# Patient Record
Sex: Female | Born: 1970 | Race: White | Hispanic: No | Marital: Married | Smoking: Never smoker
Health system: Southern US, Community
[De-identification: ages and names within clinical notes are randomized; demographics above are authoritative.]

## PROBLEM LIST (undated history)

## (undated) DIAGNOSIS — M542 Cervicalgia: Secondary | ICD-10-CM

## (undated) DIAGNOSIS — D649 Anemia, unspecified: Secondary | ICD-10-CM

## (undated) DIAGNOSIS — E119 Type 2 diabetes mellitus without complications: Secondary | ICD-10-CM

## (undated) DIAGNOSIS — R519 Headache, unspecified: Secondary | ICD-10-CM

## (undated) DIAGNOSIS — R7303 Prediabetes: Secondary | ICD-10-CM

## (undated) DIAGNOSIS — Z5189 Encounter for other specified aftercare: Secondary | ICD-10-CM

## (undated) DIAGNOSIS — K219 Gastro-esophageal reflux disease without esophagitis: Secondary | ICD-10-CM

## (undated) DIAGNOSIS — I10 Essential (primary) hypertension: Secondary | ICD-10-CM

## (undated) DIAGNOSIS — Z973 Presence of spectacles and contact lenses: Secondary | ICD-10-CM

## (undated) DIAGNOSIS — F419 Anxiety disorder, unspecified: Secondary | ICD-10-CM

## (undated) DIAGNOSIS — R7309 Other abnormal glucose: Secondary | ICD-10-CM

## (undated) DIAGNOSIS — D259 Leiomyoma of uterus, unspecified: Secondary | ICD-10-CM

## (undated) DIAGNOSIS — E1142 Type 2 diabetes mellitus with diabetic polyneuropathy: Secondary | ICD-10-CM

## (undated) DIAGNOSIS — M722 Plantar fascial fibromatosis: Secondary | ICD-10-CM

## (undated) DIAGNOSIS — R011 Cardiac murmur, unspecified: Secondary | ICD-10-CM

## (undated) DIAGNOSIS — Z86718 Personal history of other venous thrombosis and embolism: Secondary | ICD-10-CM

## (undated) HISTORY — DX: Encounter for other specified aftercare: Z51.89

## (undated) HISTORY — DX: Essential (primary) hypertension: I10

## (undated) HISTORY — PX: FOOT SURGERY: SHX648

## (undated) HISTORY — DX: Type 2 diabetes mellitus without complications: E11.9

## (undated) HISTORY — PX: TUBAL LIGATION: SHX77

## (undated) HISTORY — PX: TONSILLECTOMY: SUR1361

## (undated) HISTORY — DX: Cardiac murmur, unspecified: R01.1

## (undated) HISTORY — DX: Plantar fascial fibromatosis: M72.2

## (undated) HISTORY — DX: Anemia, unspecified: D64.9

## (undated) HISTORY — DX: Other abnormal glucose: R73.09

## (undated) HISTORY — DX: Cervicalgia: M54.2

---
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
## 1997-08-28 HISTORY — PX: TUBAL LIGATION: SHX77

## 1998-04-14 ENCOUNTER — Inpatient Hospital Stay (HOSPITAL_COMMUNITY): Admission: AD | Admit: 1998-04-14 | Discharge: 1998-04-17 | Payer: Self-pay | Admitting: Obstetrics & Gynecology

## 1998-04-18 ENCOUNTER — Encounter (HOSPITAL_COMMUNITY): Admission: RE | Admit: 1998-04-18 | Discharge: 1998-07-17 | Payer: Self-pay | Admitting: Obstetrics and Gynecology

## 1998-05-25 ENCOUNTER — Ambulatory Visit (HOSPITAL_COMMUNITY): Admission: RE | Admit: 1998-05-25 | Discharge: 1998-05-25 | Payer: Self-pay | Admitting: Obstetrics and Gynecology

## 1999-03-09 ENCOUNTER — Other Ambulatory Visit: Admission: RE | Admit: 1999-03-09 | Discharge: 1999-03-09 | Payer: Self-pay | Admitting: Obstetrics and Gynecology

## 2000-03-28 ENCOUNTER — Other Ambulatory Visit: Admission: RE | Admit: 2000-03-28 | Discharge: 2000-03-28 | Payer: Self-pay | Admitting: Obstetrics and Gynecology

## 2001-05-28 ENCOUNTER — Other Ambulatory Visit: Admission: RE | Admit: 2001-05-28 | Discharge: 2001-05-28 | Payer: Self-pay | Admitting: Obstetrics and Gynecology

## 2001-11-07 ENCOUNTER — Encounter: Payer: Self-pay | Admitting: Family Medicine

## 2001-11-07 ENCOUNTER — Ambulatory Visit (HOSPITAL_COMMUNITY): Admission: RE | Admit: 2001-11-07 | Discharge: 2001-11-07 | Payer: Self-pay | Admitting: Family Medicine

## 2002-05-30 ENCOUNTER — Other Ambulatory Visit: Admission: RE | Admit: 2002-05-30 | Discharge: 2002-05-30 | Payer: Self-pay | Admitting: Obstetrics and Gynecology

## 2003-06-12 ENCOUNTER — Other Ambulatory Visit: Admission: RE | Admit: 2003-06-12 | Discharge: 2003-06-12 | Payer: Self-pay | Admitting: Obstetrics and Gynecology

## 2004-06-17 ENCOUNTER — Other Ambulatory Visit: Admission: RE | Admit: 2004-06-17 | Discharge: 2004-06-17 | Payer: Self-pay | Admitting: Obstetrics and Gynecology

## 2005-06-21 ENCOUNTER — Other Ambulatory Visit: Admission: RE | Admit: 2005-06-21 | Discharge: 2005-06-21 | Payer: Self-pay | Admitting: Obstetrics and Gynecology

## 2010-10-25 ENCOUNTER — Other Ambulatory Visit: Payer: Self-pay | Admitting: Family Medicine

## 2010-10-25 ENCOUNTER — Ambulatory Visit
Admission: RE | Admit: 2010-10-25 | Discharge: 2010-10-25 | Disposition: A | Discharge status: Home / Self Care | Payer: 59 | Source: Ambulatory Visit | Attending: Family Medicine | Admitting: Family Medicine

## 2010-10-25 DIAGNOSIS — R053 Chronic cough: Secondary | ICD-10-CM

## 2010-10-25 DIAGNOSIS — R05 Cough: Secondary | ICD-10-CM

## 2014-01-21 ENCOUNTER — Encounter: Payer: Self-pay | Admitting: Podiatry

## 2014-01-21 ENCOUNTER — Ambulatory Visit (INDEPENDENT_AMBULATORY_CARE_PROVIDER_SITE_OTHER): Payer: BC Managed Care – PPO | Admitting: Podiatry

## 2014-01-21 ENCOUNTER — Ambulatory Visit (INDEPENDENT_AMBULATORY_CARE_PROVIDER_SITE_OTHER): Payer: BC Managed Care – PPO

## 2014-01-21 VITALS — BP 144/86 | HR 75 | Resp 16

## 2014-01-21 DIAGNOSIS — M779 Enthesopathy, unspecified: Secondary | ICD-10-CM

## 2014-01-21 NOTE — Progress Notes (Signed)
Subjective:     Patient ID: Chelsea Robbins, female   DOB: July 11, 1971, 43 y.o.   MRN: [id number]  HPI patient states that her feet hurt under her toes right over left and that this is been going on for around a year getting gradually worse. Does not remember specific injury   Review of Systems     Objective:   Physical Exam Neurovascular status intact with pain and discomfort underneath the second metatarsal phalangeal joint right over left with inflammation and fluid buildup    Assessment:     Capsulitis of the right second MPJ and mild of the left second MPJ secondary to foot structure    Plan:     H&P and x-rays reviewed and today recommended orthotics to try to disperse weight off the area and placed on oral anti-inflammatory. Reappoint when orthotics return and discuss possibility for future injection

## 2014-01-21 NOTE — Progress Notes (Signed)
   Subjective:    Patient ID: Chelsea Robbins, female    DOB: May 25, 1971, 43 y.o.   MRN: [id number]  HPI Comments: Both of my feet hurt under the toes. Its been hurting for 9 - 12 months. It started slowly and its gotten worse. It doesn't hurt all the time. It does hurt to walk and stand. i have massaged my feet.  Foot Pain      Review of Systems  Eyes: Positive for itching.  Gastrointestinal: Positive for diarrhea.  Musculoskeletal:       Difficulty walking  All other systems reviewed and are negative.      Objective:   Physical Exam        Assessment & Plan:

## 2014-01-26 ENCOUNTER — Telehealth: Payer: Self-pay | Admitting: *Deleted

## 2014-01-26 NOTE — Telephone Encounter (Signed)
In last week to see Dr. Paulla Dolly.  He was supposed to give me a prescription.  I got to the pharmacy they said they hadn't received it.  I went the next day, still hadn't received it.  I even went to the other pharmacy y'all have on file, still haven't received it.  Please give me a call.

## 2014-01-27 MED ORDER — DICLOFENAC SODIUM 75 MG PO TBEC: 75.0000 mg | DELAYED_RELEASE_TABLET | Freq: Two times a day (BID) | ORAL | Status: DC

## 2014-01-27 NOTE — Telephone Encounter (Signed)
I called and informed the patient that Dr. Paulla Dolly is prescribing Voltaren.  He apologizes for the mistake.  She asked that the prescription be sent to [facility] on [facility]. She requested the generic of Voltaren.  I e-scribed her prescription to the pharmacy.

## 2014-02-06 ENCOUNTER — Telehealth: Payer: Self-pay | Admitting: *Deleted

## 2014-02-06 NOTE — Telephone Encounter (Signed)
Prescribed an anti-inflammatory.  It says to take 1 pill twice per day but it doesn't say how many days and there's enough for 30 days.  Am I supposed to take it all 30 days or 7-10 day?  Let me know, you can leave a message.  I left her a message to take the anti-inflammatory for 30 days.

## 2014-05-19 ENCOUNTER — Other Ambulatory Visit: Payer: Self-pay | Admitting: Family Medicine

## 2014-05-19 DIAGNOSIS — R7989 Other specified abnormal findings of blood chemistry: Secondary | ICD-10-CM

## 2014-05-19 DIAGNOSIS — R945 Abnormal results of liver function studies: Principal | ICD-10-CM

## 2014-06-11 ENCOUNTER — Ambulatory Visit
Admission: RE | Admit: 2014-06-11 | Discharge: 2014-06-11 | Disposition: A | Discharge status: Home / Self Care | Payer: BC Managed Care – PPO | Source: Ambulatory Visit | Attending: Family Medicine | Admitting: Family Medicine

## 2014-06-11 DIAGNOSIS — R7989 Other specified abnormal findings of blood chemistry: Secondary | ICD-10-CM

## 2014-06-11 DIAGNOSIS — R945 Abnormal results of liver function studies: Principal | ICD-10-CM

## 2014-07-17 ENCOUNTER — Other Ambulatory Visit: Payer: Self-pay | Admitting: Family Medicine

## 2014-07-17 ENCOUNTER — Other Ambulatory Visit: Payer: BC Managed Care – PPO

## 2014-07-17 ENCOUNTER — Ambulatory Visit
Admission: RE | Admit: 2014-07-17 | Discharge: 2014-07-17 | Disposition: A | Discharge status: Home / Self Care | Payer: BC Managed Care – PPO | Source: Ambulatory Visit | Attending: Family Medicine | Admitting: Family Medicine

## 2014-07-17 DIAGNOSIS — R0789 Other chest pain: Secondary | ICD-10-CM

## 2014-12-18 ENCOUNTER — Other Ambulatory Visit: Payer: Self-pay | Admitting: Gastroenterology

## 2014-12-18 DIAGNOSIS — R1310 Dysphagia, unspecified: Secondary | ICD-10-CM

## 2014-12-18 DIAGNOSIS — K219 Gastro-esophageal reflux disease without esophagitis: Secondary | ICD-10-CM

## 2014-12-18 DIAGNOSIS — IMO0001 Reserved for inherently not codable concepts without codable children: Secondary | ICD-10-CM

## 2014-12-23 ENCOUNTER — Ambulatory Visit
Admission: RE | Admit: 2014-12-23 | Discharge: 2014-12-23 | Disposition: A | Discharge status: Home / Self Care | Payer: Self-pay | Source: Ambulatory Visit | Attending: Gastroenterology | Admitting: Gastroenterology

## 2014-12-23 DIAGNOSIS — R1310 Dysphagia, unspecified: Secondary | ICD-10-CM

## 2014-12-23 DIAGNOSIS — K219 Gastro-esophageal reflux disease without esophagitis: Secondary | ICD-10-CM

## 2014-12-23 DIAGNOSIS — IMO0001 Reserved for inherently not codable concepts without codable children: Secondary | ICD-10-CM

## 2016-04-17 DIAGNOSIS — D649 Anemia, unspecified: Secondary | ICD-10-CM | POA: Diagnosis not present

## 2016-04-17 DIAGNOSIS — Z6841 Body Mass Index (BMI) 40.0 and over, adult: Secondary | ICD-10-CM | POA: Diagnosis not present

## 2016-04-17 DIAGNOSIS — R748 Abnormal levels of other serum enzymes: Secondary | ICD-10-CM | POA: Diagnosis not present

## 2016-06-19 DIAGNOSIS — R748 Abnormal levels of other serum enzymes: Secondary | ICD-10-CM | POA: Diagnosis not present

## 2016-06-19 DIAGNOSIS — Z01419 Encounter for gynecological examination (general) (routine) without abnormal findings: Secondary | ICD-10-CM | POA: Diagnosis not present

## 2016-06-19 DIAGNOSIS — Z6841 Body Mass Index (BMI) 40.0 and over, adult: Secondary | ICD-10-CM | POA: Diagnosis not present

## 2016-06-19 DIAGNOSIS — Z124 Encounter for screening for malignant neoplasm of cervix: Secondary | ICD-10-CM | POA: Diagnosis not present

## 2016-06-19 DIAGNOSIS — Z1231 Encounter for screening mammogram for malignant neoplasm of breast: Secondary | ICD-10-CM | POA: Diagnosis not present

## 2016-06-19 DIAGNOSIS — N760 Acute vaginitis: Secondary | ICD-10-CM | POA: Diagnosis not present

## 2016-09-12 DIAGNOSIS — R748 Abnormal levels of other serum enzymes: Secondary | ICD-10-CM | POA: Diagnosis not present

## 2017-02-19 DIAGNOSIS — R431 Parosmia: Secondary | ICD-10-CM | POA: Diagnosis not present

## 2017-03-21 DIAGNOSIS — J370 Chronic laryngitis: Secondary | ICD-10-CM | POA: Diagnosis not present

## 2017-03-21 DIAGNOSIS — J320 Chronic maxillary sinusitis: Secondary | ICD-10-CM | POA: Diagnosis not present

## 2017-03-21 DIAGNOSIS — J322 Chronic ethmoidal sinusitis: Secondary | ICD-10-CM | POA: Diagnosis not present

## 2017-03-21 DIAGNOSIS — K210 Gastro-esophageal reflux disease with esophagitis: Secondary | ICD-10-CM | POA: Diagnosis not present

## 2017-04-16 DIAGNOSIS — J301 Allergic rhinitis due to pollen: Secondary | ICD-10-CM | POA: Diagnosis not present

## 2017-06-05 DIAGNOSIS — Z0000 Encounter for general adult medical examination without abnormal findings: Secondary | ICD-10-CM | POA: Diagnosis not present

## 2017-06-05 DIAGNOSIS — Z13220 Encounter for screening for lipoid disorders: Secondary | ICD-10-CM | POA: Diagnosis not present

## 2017-06-05 DIAGNOSIS — E559 Vitamin D deficiency, unspecified: Secondary | ICD-10-CM | POA: Diagnosis not present

## 2017-06-05 DIAGNOSIS — D509 Iron deficiency anemia, unspecified: Secondary | ICD-10-CM | POA: Diagnosis not present

## 2017-07-09 DIAGNOSIS — Z6841 Body Mass Index (BMI) 40.0 and over, adult: Secondary | ICD-10-CM | POA: Diagnosis not present

## 2017-07-09 DIAGNOSIS — Z01419 Encounter for gynecological examination (general) (routine) without abnormal findings: Secondary | ICD-10-CM | POA: Diagnosis not present

## 2017-07-09 DIAGNOSIS — Z1231 Encounter for screening mammogram for malignant neoplasm of breast: Secondary | ICD-10-CM | POA: Diagnosis not present

## 2017-10-12 DIAGNOSIS — J209 Acute bronchitis, unspecified: Secondary | ICD-10-CM | POA: Diagnosis not present

## 2018-01-26 ENCOUNTER — Ambulatory Visit (INDEPENDENT_AMBULATORY_CARE_PROVIDER_SITE_OTHER): Payer: BLUE CROSS/BLUE SHIELD

## 2018-01-26 ENCOUNTER — Encounter: Payer: Self-pay | Admitting: Podiatry

## 2018-01-26 ENCOUNTER — Ambulatory Visit (INDEPENDENT_AMBULATORY_CARE_PROVIDER_SITE_OTHER): Payer: BLUE CROSS/BLUE SHIELD | Admitting: Podiatry

## 2018-01-26 VITALS — BP 128/84 | HR 84

## 2018-01-26 DIAGNOSIS — M779 Enthesopathy, unspecified: Secondary | ICD-10-CM | POA: Diagnosis not present

## 2018-01-26 DIAGNOSIS — S99921A Unspecified injury of right foot, initial encounter: Secondary | ICD-10-CM | POA: Diagnosis not present

## 2018-01-26 DIAGNOSIS — Z6841 Body Mass Index (BMI) 40.0 and over, adult: Secondary | ICD-10-CM

## 2018-01-26 DIAGNOSIS — E669 Obesity, unspecified: Secondary | ICD-10-CM | POA: Insufficient documentation

## 2018-01-26 DIAGNOSIS — N926 Irregular menstruation, unspecified: Secondary | ICD-10-CM | POA: Insufficient documentation

## 2018-01-26 DIAGNOSIS — L290 Pruritus ani: Secondary | ICD-10-CM | POA: Insufficient documentation

## 2018-01-26 MED ORDER — DICLOFENAC SODIUM 75 MG PO TBEC: 75.0000 mg | DELAYED_RELEASE_TABLET | Freq: Two times a day (BID) | ORAL | 2 refills | Status: DC

## 2018-01-26 MED ORDER — TRIAMCINOLONE ACETONIDE 10 MG/ML IJ SUSP
10.0000 mg | Freq: Once | INTRAMUSCULAR | Status: AC
  Administered 2018-01-26: 10 mg

## 2018-01-26 NOTE — Progress Notes (Signed)
Subjective:   Patient ID: Chelsea Robbins, female   DOB: 47 y.o.   MRN: [id number]   HPI Patient presents stating she is had a lot of pain on the outside of the right foot and it is been going on for a while as she is trying to increase activity and then she started to develop pain in the big toe right over left foot.  Patient states is been going on for a while and she does not smoke and likes to be active   Review of Systems  All other systems reviewed and are negative.       Objective:  Physical Exam  Constitutional: She appears well-developed and well-nourished.  Cardiovascular: Intact distal pulses.  Pulmonary/Chest: Effort normal.  Musculoskeletal: Normal range of motion.  Neurological: She is alert.  Skin: Skin is warm.  Nursing note and vitals reviewed.   Neurovascular status intact muscle strength is adequate range of motion was normal with patient having morbid obesity present and does have no restriction of motion of the first MPJ right with mild discomfort.  There is quite a bit of pain around the peroneal insertion of the base of the fifth metatarsal right with fluid buildup noted around the insertion of the tendon into the bone     Assessment:  Peroneal tendinitis acute nature with probability of creating discomfort in the big toe joint secondary to activity changes     Plan:  H&P both conditions reviewed and careful perineal injection administered right 3 mg Kenalog 5 mg Xylocaine into the base advised on ice therapy supportive shoes placed on diclofenac 75 mg twice daily and will be seen back if symptoms persist  X-rays indicate that there is no signs of fracture or arthritic process occurring

## 2018-02-01 DIAGNOSIS — I10 Essential (primary) hypertension: Secondary | ICD-10-CM | POA: Diagnosis not present

## 2018-02-01 DIAGNOSIS — R002 Palpitations: Secondary | ICD-10-CM | POA: Diagnosis not present

## 2018-05-03 DIAGNOSIS — R431 Parosmia: Secondary | ICD-10-CM | POA: Diagnosis not present

## 2018-05-03 DIAGNOSIS — J329 Chronic sinusitis, unspecified: Secondary | ICD-10-CM | POA: Diagnosis not present

## 2018-08-05 DIAGNOSIS — Z6841 Body Mass Index (BMI) 40.0 and over, adult: Secondary | ICD-10-CM | POA: Diagnosis not present

## 2018-08-05 DIAGNOSIS — Z1231 Encounter for screening mammogram for malignant neoplasm of breast: Secondary | ICD-10-CM | POA: Diagnosis not present

## 2018-08-06 LAB — HM MAMMOGRAPHY

## 2018-08-12 DIAGNOSIS — Z0000 Encounter for general adult medical examination without abnormal findings: Secondary | ICD-10-CM | POA: Diagnosis not present

## 2018-08-12 DIAGNOSIS — Z6841 Body Mass Index (BMI) 40.0 and over, adult: Secondary | ICD-10-CM | POA: Diagnosis not present

## 2018-08-12 DIAGNOSIS — E559 Vitamin D deficiency, unspecified: Secondary | ICD-10-CM | POA: Diagnosis not present

## 2018-08-12 DIAGNOSIS — Z124 Encounter for screening for malignant neoplasm of cervix: Secondary | ICD-10-CM | POA: Diagnosis not present

## 2018-08-12 DIAGNOSIS — Z01419 Encounter for gynecological examination (general) (routine) without abnormal findings: Secondary | ICD-10-CM | POA: Diagnosis not present

## 2018-08-12 LAB — CBC AND DIFFERENTIAL
HCT: 38 (ref 36–46)
Hemoglobin: 12.3 (ref 12.0–16.0)
Neutrophils Absolute: 8
Platelets: 352 (ref 150–399)
WBC: 10.7

## 2018-08-12 LAB — LIPID PANEL
Cholesterol: 198 (ref 0–200)
HDL: 67 (ref 35–70)
Triglycerides: 133 (ref 40–160)

## 2018-08-12 LAB — HEPATIC FUNCTION PANEL
ALT: 66 — AB (ref 7–35)
AST: 57 — AB (ref 13–35)
Alkaline Phosphatase: 83 (ref 25–125)
Bilirubin, Total: 0.4

## 2018-08-12 LAB — BASIC METABOLIC PANEL
BUN: 15 (ref 4–21)
Creatinine: 0.9 (ref <=1.1)
Glucose: 90
Potassium: 4.2 (ref 3.4–5.3)
Sodium: 142 (ref 137–147)

## 2018-08-12 LAB — TSH: TSH: 2.83 (ref <=5.90)

## 2018-08-12 LAB — VITAMIN D 25 HYDROXY (VIT D DEFICIENCY, FRACTURES): Vit D, 25-Hydroxy: 23.7

## 2018-08-26 DIAGNOSIS — Z0000 Encounter for general adult medical examination without abnormal findings: Secondary | ICD-10-CM | POA: Diagnosis not present

## 2018-08-26 DIAGNOSIS — L918 Other hypertrophic disorders of the skin: Secondary | ICD-10-CM | POA: Diagnosis not present

## 2018-08-26 DIAGNOSIS — R202 Paresthesia of skin: Secondary | ICD-10-CM | POA: Diagnosis not present

## 2018-08-26 DIAGNOSIS — R748 Abnormal levels of other serum enzymes: Secondary | ICD-10-CM | POA: Diagnosis not present

## 2018-08-26 DIAGNOSIS — D509 Iron deficiency anemia, unspecified: Secondary | ICD-10-CM | POA: Diagnosis not present

## 2018-09-09 DIAGNOSIS — Z124 Encounter for screening for malignant neoplasm of cervix: Secondary | ICD-10-CM | POA: Diagnosis not present

## 2018-09-09 DIAGNOSIS — R748 Abnormal levels of other serum enzymes: Secondary | ICD-10-CM | POA: Diagnosis not present

## 2018-09-09 LAB — HEPATIC FUNCTION PANEL
ALT: 62 — AB (ref 7–35)
AST: 52 — AB (ref 13–35)
Alkaline Phosphatase: 74 (ref 25–125)
Bilirubin, Total: 0.3

## 2018-09-09 LAB — HM PAP SMEAR

## 2018-09-19 DIAGNOSIS — M542 Cervicalgia: Secondary | ICD-10-CM | POA: Diagnosis not present

## 2018-11-06 DIAGNOSIS — M79671 Pain in right foot: Secondary | ICD-10-CM | POA: Diagnosis not present

## 2018-11-06 DIAGNOSIS — M79672 Pain in left foot: Secondary | ICD-10-CM | POA: Diagnosis not present

## 2018-12-31 ENCOUNTER — Telehealth: Payer: Self-pay | Admitting: Neurology

## 2018-12-31 NOTE — Telephone Encounter (Signed)
Pt has consented to a Virtual Visit and for the insurance to be billed as such. E-mail has been added to the pt chart where she would like the link sent to.

## 2019-01-01 NOTE — Telephone Encounter (Signed)
E-mail link has been provided, pre charting will be completed closer to her appt date.

## 2019-01-09 ENCOUNTER — Encounter: Payer: Self-pay | Admitting: Neurology

## 2019-01-09 NOTE — Telephone Encounter (Signed)
I contacted the patient and was able to update meds, allergies and pmh.  Patient has been referred by Dr. Vickki Hearing for bilateral feet pain. Patient describes more neuropathy like pain (numbess/tingling)  Patient confirmed she received the e-mail link and understands to sign on to our virtual waiting room 15 mins prior to visit.  Patient also understands we will be receiving a call the day of the appt to verify she is not having any connectivity issues.

## 2019-01-13 ENCOUNTER — Ambulatory Visit (INDEPENDENT_AMBULATORY_CARE_PROVIDER_SITE_OTHER): Payer: BLUE CROSS/BLUE SHIELD | Admitting: Neurology

## 2019-01-13 ENCOUNTER — Other Ambulatory Visit: Payer: Self-pay

## 2019-01-13 ENCOUNTER — Encounter: Payer: Self-pay | Admitting: Neurology

## 2019-01-13 DIAGNOSIS — R202 Paresthesia of skin: Secondary | ICD-10-CM | POA: Diagnosis not present

## 2019-01-13 NOTE — Progress Notes (Signed)
     Virtual Visit via Video Note  I connected with Chelsea Robbins on 01/13/19 at  3:00 PM EDT by a video enabled telemedicine application and verified that I am speaking with the correct person using two identifiers.  Location: Patient: The patient is at home. Provider: Physician in office.   I discussed the limitations of evaluation and management by telemedicine and the availability of in person appointments. The patient expressed understanding and agreed to proceed.  History of Present Illness: Chelsea Robbins is a 48 year old right-handed white female with a history of onset of numbness and tingling in the toes and ball the feet in November 2019.  Over time, this has become more prominent.  The patient reports that she does have some left-sided neck discomfort and left shoulder pain but this has improved over time.  She does have occasional low back pain without radiation down the legs.  She reports no weakness of the arms or legs.  She does occasionally have tingling in the fingers that comes and goes but this is not frequent.  She reports no balance changes or falls.  She denies issues controlling the bowels or bladder with exception of occasional stress incontinence of the bladder.  The patient has not been bothered with her sleeping because of the discomfort.  She will note some of the tingling whether or not she is on her feet or whether she is resting.  She reports occasional sharp pains in the right occipital area of the head.   Observations/Objective: The video evaluation reveals that the patient is obese, she is alert and cooperative.  She has full extraocular movements.  Speech is well enunciated, not aphasic.  She has ability to protrude the tongue in the midline with good lateral movement of the tongue.  Face is symmetric.  The patient has good finger-nose-finger and heel shin bilaterally.  Gait is normal.  Tandem gait is normal.  Romberg is negative.  Range of movement the cervical  spine is full.  Assessment and Plan: 1.  Paresthesias in the feet, possible peripheral neuropathy  The patient will be set up for nerve conduction studies on both legs and EMG on one leg.  If a peripheral neuropathy is present, blood work will be done.  She will otherwise follow-up in about 6 months.  Follow Up Instructions: 33-month follow-up, may see nurse practitioner.   I discussed the assessment and treatment plan with the patient. The patient was provided an opportunity to ask questions and all were answered. The patient agreed with the plan and demonstrated an understanding of the instructions.   The patient was advised to call back or seek an in-person evaluation if the symptoms worsen or if the condition fails to improve as anticipated.  I provided 25 minutes of non-face-to-face time during this encounter.   Kathrynn Ducking, MD

## 2019-02-04 DIAGNOSIS — R748 Abnormal levels of other serum enzymes: Secondary | ICD-10-CM | POA: Diagnosis not present

## 2019-02-04 DIAGNOSIS — R7303 Prediabetes: Secondary | ICD-10-CM | POA: Diagnosis not present

## 2019-02-04 LAB — HEPATIC FUNCTION PANEL
ALT: 72 — AB (ref 7–35)
AST: 65 — AB (ref 13–35)
Alkaline Phosphatase: 76 (ref 25–125)
Bilirubin, Total: 0.4

## 2019-02-04 LAB — BASIC METABOLIC PANEL
BUN: 18 (ref 4–21)
Creatinine: 0.9 (ref <=1.1)
Glucose: 96
Potassium: 4.4 (ref 3.4–5.3)
Sodium: 138 (ref 137–147)

## 2019-02-04 LAB — HEMOGLOBIN A1C: Hemoglobin A1C: 6

## 2019-02-19 ENCOUNTER — Encounter: Payer: Self-pay | Admitting: Neurology

## 2019-02-19 ENCOUNTER — Ambulatory Visit (INDEPENDENT_AMBULATORY_CARE_PROVIDER_SITE_OTHER): Payer: BC Managed Care – PPO | Admitting: Neurology

## 2019-02-19 ENCOUNTER — Other Ambulatory Visit: Payer: Self-pay

## 2019-02-19 DIAGNOSIS — R202 Paresthesia of skin: Secondary | ICD-10-CM | POA: Diagnosis not present

## 2019-02-19 DIAGNOSIS — E1142 Type 2 diabetes mellitus with diabetic polyneuropathy: Secondary | ICD-10-CM

## 2019-02-19 HISTORY — DX: Type 2 diabetes mellitus with diabetic polyneuropathy: E11.42

## 2019-02-19 NOTE — Progress Notes (Addendum)
The patient comes in today for EMG and nerve conduction study evaluation.  Nerve conduction studies do show mild sensory changes that could be related to a peripheral neuropathy.  The patient does have borderline diabetes, her last hemoglobin A1c was 6.0.  Further blood work will be done today.  She has a revisit follow-up in about 6 months.  She is not having significant neuropathy pain currently, we will not add any medications at this time.      [place]    Nerve / Sites Muscle Latency Ref. Amplitude Ref. Rel Amp Segments Distance Velocity Ref. Area    ms ms mV mV %  cm m/s m/s mVms  R Peroneal - EDB     Ankle EDB 4.5 ?6.5 4.2 ?2.0 100 Ankle - EDB 9   13.1     Fib head EDB 10.7  4.4  104 Fib head - Ankle 27 44 ?44 17.3     Pop fossa EDB 13.1  3.3  76.2 Pop fossa - Fib head 10 42 ?44 13.1         Pop fossa - Ankle      L Peroneal - EDB     Ankle EDB 4.2 ?6.5 6.2 ?2.0 100 Ankle - EDB 9   20.6     Fib head EDB 9.1  5.9  95.5 Fib head - Ankle 27 55 ?44 19.9     Pop fossa EDB 10.9  5.8  98.4 Pop fossa - Fib head 10 55 ?44 20.2         Pop fossa - Ankle      R Tibial - AH     Ankle AH 3.3 ?5.8 9.1 ?4.0 100 Ankle - AH 9   22.1     Pop fossa AH 11.4  6.3  69.4 Pop fossa - Ankle 35 44 ?41 16.8  L Tibial - AH     Ankle AH 3.1 ?5.8 7.2 ?4.0 100 Ankle - AH 9   17.0     Pop fossa AH 11.7  4.7  65.7 Pop fossa - Ankle 35 41 ?41 14.5             SNC    Nerve / Sites Rec. Site Peak Lat Ref.  Amp Ref. Segments Distance    ms ms V V  cm  R Sural - Ankle (Calf)     Calf Ankle NR ?4.4 NR ?6 Calf - Ankle 14  L Sural - Ankle (Calf)     Calf Ankle NR ?4.4 NR ?6 Calf - Ankle 14  R Superficial peroneal - Ankle     Lat leg Ankle 3.4 ?4.4 9 ?6 Lat leg - Ankle 14  L Superficial peroneal - Ankle     Lat leg Ankle 3.6 ?4.4 7 ?6 Lat leg - Ankle 14              F  Wave    Nerve F Lat Ref.   ms ms  R Tibial - AH 46.3 ?56.0  L Tibial - AH 46.6 ?56.0

## 2019-02-19 NOTE — Procedures (Signed)
     HISTORY:  Chelsea Robbins is a 48 year old patient with a history of significant obesity and prediabetes who reports some numbness in the toes and soles of the feet bilaterally.  She does report occasional mild low back pain without any radiation down the legs.  She is being evaluated for possible neuropathy or a lumbosacral radiculopathy.  NERVE CONDUCTION STUDIES:  Nerve conduction studies were performed on both lower extremities.  The distal motor latencies and motor amplitudes for the peroneal and posterior tibial nerves were within normal limits bilaterally.  Slowing was seen below the fibular head only on the right peroneal nerve, normal for the left peroneal nerve and for the posterior tibial nerves bilaterally.  The sural sensory latencies were unobtainable bilaterally.  The peroneal sensory latencies were normal bilaterally.  The F-wave latencies for the posterior tibial nerves were within normal limits bilaterally.  EMG STUDIES:  EMG study was performed on the right lower extremity:  The tibialis anterior muscle reveals 2 to 4K motor units with full recruitment. No fibrillations or positive waves were seen. The peroneus tertius muscle reveals 2 to 4K motor units with full recruitment. No fibrillations or positive waves were seen. The medial gastrocnemius muscle reveals 1 to 3K motor units with full recruitment. No fibrillations or positive waves were seen. The vastus lateralis muscle reveals 2 to 4K motor units with full recruitment. No fibrillations or positive waves were seen. The iliopsoas muscle reveals 2 to 4K motor units with full recruitment. No fibrillations or positive waves were seen. The biceps femoris muscle (long head) reveals 2 to 4K motor units with full recruitment. No fibrillations or positive waves were seen. The lumbosacral paraspinal muscles were tested at 3 levels, and revealed no abnormalities of insertional activity at all 3 levels tested. There was good  relaxation.   IMPRESSION:  Nerve conduction studies done on both lower extremities show some mild changes that could be consistent with an early peripheral neuropathy.  Clinical correlation is required.  EMG evaluation of the right lower extremity is unremarkable without evidence of an overlying lumbosacral radiculopathy.  Jill Alexanders MD 02/19/2019 4:21 PM  [facility] [address] [place], [place] [address]  Phone [phone] Fax [phone]

## 2019-02-19 NOTE — Progress Notes (Signed)
Please refer to EMG and nerve conduction procedure note.  

## 2019-02-20 LAB — VITAMIN B12: Vitamin B-12: 689 pg/mL (ref 232–1245)

## 2019-02-20 LAB — MULTIPLE MYELOMA PANEL, SERUM
Albumin SerPl Elph-Mcnc: 3.6 g/dL (ref 2.9–4.4)
Albumin/Glob SerPl: 1.3 (ref 0.7–1.7)
Alpha 1: 0.3 g/dL (ref 0.0–0.4)
Alpha2 Glob SerPl Elph-Mcnc: 0.8 g/dL (ref 0.4–1.0)
B-Globulin SerPl Elph-Mcnc: 1 g/dL (ref 0.7–1.3)
Gamma Glob SerPl Elph-Mcnc: 0.9 g/dL (ref 0.4–1.8)
Globulin, Total: 3 g/dL (ref 2.2–3.9)
IgA/Immunoglobulin A, Serum: 140 mg/dL (ref 87–352)
IgG (Immunoglobin G), Serum: 1087 mg/dL (ref 586–1602)
IgM (Immunoglobulin M), Srm: 108 mg/dL (ref 26–217)
Total Protein: 6.6 g/dL (ref 6.0–8.5)

## 2019-02-20 LAB — ANGIOTENSIN CONVERTING ENZYME: Angio Convert Enzyme: 72 U/L (ref 14–82)

## 2019-02-20 LAB — SEDIMENTATION RATE: Sed Rate: 30 mm/hr (ref 0–32)

## 2019-02-20 LAB — ANA W/REFLEX: Anti Nuclear Antibody (ANA): NEGATIVE

## 2019-02-20 LAB — B. BURGDORFI ANTIBODIES: Lyme IgG/IgM Ab: <0.91 {ISR} (ref 0.00–0.90)

## 2019-02-24 ENCOUNTER — Telehealth: Payer: Self-pay

## 2019-02-24 NOTE — Telephone Encounter (Signed)
-----   Message from Kathrynn Ducking, MD sent at 02/20/2019  4:56 PM EDT -----  The blood work results are unremarkable. Please call the patient. ----- Message ----- From: Lavone Neri Lab Results In Sent: 02/20/2019   7:37 AM EDT To: Kathrynn Ducking, MD

## 2019-02-24 NOTE — Telephone Encounter (Signed)
I reached out to the pt and advised on lab results. She verbalized understanding.

## 2019-03-20 NOTE — Progress Notes (Signed)
Subjective:    Patient ID: Chelsea Robbins, female    DOB: Nov 09, 1970, 48 y.o.   MRN: [id number]  HPI:  Chelsea Robbins is here to establish as a new pt.  She is a pleasant 48 year old female. 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  She would like to loss  75- 100 lbs   Patient Care Team    Relationship Specialty Notifications Start End  Mina Marble D, NP PCP - General Family Medicine  03/24/19   Pedro Earls, MD Attending Physician Family Medicine  01/13/19   Donald Prose, MD Consulting Physician Family Medicine  03/24/19 03/24/19  Ob/Gyn, Esmond Plants    03/24/19   Pa, Le[place]  Family Medicine  03/24/19 03/24/19    Patient Active Problem List   Diagnosis Date Noted  . Healthcare maintenance 03/24/2019  . Diabetic peripheral neuropathy ([place]) 02/19/2019  . BMI 45.0-49.9, adult ([place]) 01/26/2018  . Irregular periods 01/26/2018  . Morbid obesity ([place]) 01/26/2018  . Pruritus ani 01/26/2018     Past Medical History:  Diagnosis Date  . Diabetes mellitus without complication (HCC)    prediabetes  . Diabetic peripheral neuropathy ([place]) 02/19/2019  . Elevated hemoglobin A1c   . Hypertension   . Neck pain   . Plantar fasciitis      Past Surgical History:  Procedure Laterality Date  . CESAREAN SECTION    . FOOT SURGERY     Bilateral  . TONSILLECTOMY    . TUBAL LIGATION       Family History  Problem Relation Age of Onset  . Cancer Mother        GYN  . Drug abuse Father   . Cancer Father   . Cancer Maternal Aunt        breast,  lung  . Cancer Maternal Grandmother        brain  . Cancer Maternal Grandfather        throat     Social History   Substance and Sexual Activity  Drug Use Not Currently     Social History   Substance and Sexual Activity  Alcohol Use Yes   Comment: Rarely      Social History   Tobacco Use  Smoking Status Never Smoker  Smokeless Tobacco Never Used     Outpatient Encounter Medications as of 03/24/2019  Medication Sig  . Cholecalciferol (VITAMIN D3 PO) Take by mouth. Takes between 2000 and 5000 units.  . Ferrous Sulfate (IRON) 325 (65 Fe) MG TABS Take by mouth. 1 tab per week  . triamterene-hydrochlorothiazide (MAXZIDE-25) 37.5-25 MG tablet Take 1 tablet by mouth every morning.   No facility-administered encounter medications on file as of 03/24/2019.     Allergies: Diclofenac  Body mass index is 46.02 kg/m.  Blood pressure 129/80, pulse 76, temperature 98.7 F (37.1 C), temperature source Oral, height 5' 4.5" (1.638 m), weight 272 lb 4.8 oz (123.5 kg), last menstrual period 03/07/2019, SpO2 96 %.     Review of Systems  Constitutional: Positive for fatigue. Negative for activity change,  appetite change, chills, diaphoresis, fever and unexpected weight change.  HENT: Negative for congestion.   Eyes: Negative for visual disturbance.  Respiratory: Negative for cough, chest tightness, shortness of breath, wheezing and stridor.   Cardiovascular: Negative for chest pain, palpitations and leg swelling.  Gastrointestinal: Negative for abdominal distention, anal bleeding, blood in stool, constipation, diarrhea, nausea and vomiting.  Endocrine: Negative for cold intolerance, heat intolerance, polydipsia, polyphagia and polyuria.  Genitourinary: Negative for difficulty urinating and flank pain.  Musculoskeletal: Positive for arthralgias and myalgias.  Skin: Negative for color change, pallor, rash and wound.  Neurological: Negative for dizziness and headaches.   Hematological: Negative for adenopathy. Does not bruise/bleed easily.  Psychiatric/Behavioral: Negative for agitation, behavioral problems, confusion, decreased concentration, dysphoric mood, hallucinations, self-injury, sleep disturbance and suicidal ideas. The patient is not nervous/anxious and is not hyperactive.        Objective:   Physical Exam Vitals signs and nursing note reviewed.  Constitutional:      General: She is not in acute distress.    Appearance: She is obese. She is not ill-appearing, toxic-appearing or diaphoretic.  HENT:     Head: Normocephalic and atraumatic.  Cardiovascular:     Rate and Rhythm: Normal rate and regular rhythm.     Pulses: Normal pulses.     Heart sounds: Normal heart sounds. No murmur. No friction rub. No gallop.   Pulmonary:     Effort: Pulmonary effort is normal. No respiratory distress.     Breath sounds: Normal breath sounds. No stridor. No wheezing, rhonchi or rales.  Chest:     Chest wall: No tenderness.  Skin:    General: Skin is warm and dry.     Capillary Refill: Capillary refill takes less than 2 seconds.  Neurological:     Mental Status: She is alert and oriented to person, place, and time.  Psychiatric:        Mood and Affect: Mood normal.        Behavior: Behavior normal.        Thought Content: Thought content normal.        Judgment: Judgment normal.       Assessment & Plan:   1. Prediabetes   2. Healthcare maintenance   3. Hypertension, unspecified type   4. BMI 45.0-49.9, adult [facility])     Healthcare maintenance Increase water intake, strive for at least half your body weight in ounces per day. Follow Mediterranean diet Increase regular exercise.  Recommend at least 30 minutes daily, 5 days per week of walking, biking, swimming, YouTube/Pinterest workout videos. Referral to [facility]. When you need refills on your medications, please tell your pharmacy to send to our clinic for refill. Continue  to social distance and wear a mask when in public. Schedule physical in 4 months, fasting labs the week prior.  BMI 45.0-49.9, adult (HCC) Body mass index is 46.02 kg/m.  Current 272 Mediterranean diet Increase daily walking Referral to Healthy Wt/Wellness     FOLLOW-UP:  Return in about 4 months (around 07/25/2019) for CPE, Fasting Labs.

## 2019-03-24 ENCOUNTER — Other Ambulatory Visit: Payer: Self-pay

## 2019-03-24 ENCOUNTER — Encounter: Payer: Self-pay | Admitting: Adult Health

## 2019-03-24 ENCOUNTER — Ambulatory Visit (INDEPENDENT_AMBULATORY_CARE_PROVIDER_SITE_OTHER): Payer: BC Managed Care – PPO | Admitting: Adult Health

## 2019-03-24 VITALS — BP 129/80 | HR 76 | Temp 98.7°F | Ht 64.5 in | Wt 272.3 lb

## 2019-03-24 DIAGNOSIS — R7303 Prediabetes: Secondary | ICD-10-CM

## 2019-03-24 DIAGNOSIS — I10 Essential (primary) hypertension: Secondary | ICD-10-CM | POA: Diagnosis not present

## 2019-03-24 DIAGNOSIS — Z6841 Body Mass Index (BMI) 40.0 and over, adult: Secondary | ICD-10-CM

## 2019-03-24 DIAGNOSIS — Z0000 Encounter for general adult medical examination without abnormal findings: Secondary | ICD-10-CM | POA: Insufficient documentation

## 2019-03-24 NOTE — Patient Instructions (Addendum)
Mediterranean Diet A Mediterranean diet refers to food and lifestyle choices that are based on the traditions of countries located on the [employer]. This way of eating has been shown to help prevent certain conditions and improve outcomes for people who have chronic diseases, like kidney disease and heart disease. What are tips for following this plan? Lifestyle  Cook and eat meals together with your family, when possible.  Drink enough fluid to keep your urine clear or pale yellow.  Be physically active every day. This includes: ? Aerobic exercise like running or swimming. ? Leisure activities like gardening, walking, or housework.  Get 7-8 hours of sleep each night.  If recommended by your health care provider, drink red wine in moderation. This means 1 glass a day for nonpregnant women and 2 glasses a day for men. A glass of wine equals 5 oz (150 mL). Reading food labels   Check the serving size of packaged foods. For foods such as rice and pasta, the serving size refers to the amount of cooked product, not dry.  Check the total fat in packaged foods. Avoid foods that have saturated fat or trans fats.  Check the ingredients list for added sugars, such as corn syrup. Shopping  At the grocery store, buy most of your food from the areas near the walls of the store. This includes: ? Fresh fruits and vegetables (produce). ? Grains, beans, nuts, and seeds. Some of these may be available in unpackaged forms or large amounts (in bulk). ? Fresh seafood. ? Poultry and eggs. ? Low-fat dairy products.  Buy whole ingredients instead of prepackaged foods.  Buy fresh fruits and vegetables in-season from local farmers markets.  Buy frozen fruits and vegetables in resealable bags.  If you do not have access to quality fresh seafood, buy precooked frozen shrimp or canned fish, such as tuna, salmon, or sardines.  Buy small amounts of raw or cooked vegetables, salads, or olives from  the deli or salad bar at your store.  Stock your pantry so you always have certain foods on hand, such as olive oil, canned tuna, canned tomatoes, rice, pasta, and beans. Cooking  Cook foods with extra-virgin olive oil instead of using butter or other vegetable oils.  Have meat as a side dish, and have vegetables or grains as your main dish. This means having meat in small portions or adding small amounts of meat to foods like pasta or stew.  Use beans or vegetables instead of meat in common dishes like chili or lasagna.  Experiment with different cooking methods. Try roasting or broiling vegetables instead of steaming or sauteing them.  Add frozen vegetables to soups, stews, pasta, or rice.  Add nuts or seeds for added healthy fat at each meal. You can add these to yogurt, salads, or vegetable dishes.  Marinate fish or vegetables using olive oil, lemon juice, garlic, and fresh herbs. Meal planning   Plan to eat 1 vegetarian meal one day each week. Try to work up to 2 vegetarian meals, if possible.  Eat seafood 2 or more times a week.  Have healthy snacks readily available, such as: ? Vegetable sticks with hummus. ? [place] yogurt. ? Fruit and nut trail mix.  Eat balanced meals throughout the week. This includes: ? Fruit: 2-3 servings a day ? Vegetables: 4-5 servings a day ? Low-fat dairy: 2 servings a day ? Fish, poultry, or lean meat: 1 serving a day ? Beans and legumes: 2 or more servings a week ?  Nuts and seeds: 1-2 servings a day ? Whole grains: 6-8 servings a day ? Extra-virgin olive oil: 3-4 servings a day  Limit red meat and sweets to only a few servings a month What are my food choices?  Mediterranean diet ? Recommended  Grains: Whole-grain pasta. Brown rice. Bulgar wheat. Polenta. Couscous. Whole-wheat bread. [place].  Vegetables: Artichokes. Beets. Broccoli. Cabbage. Carrots. Eggplant. Green beans. Chard. Kale. Spinach. Onions. Leeks. Peas. Squash.  Tomatoes. Peppers. Radishes.  Fruits: Apples. Apricots. Avocado. Berries. Bananas. Cherries. Dates. Figs. Grapes. Lemons. Melon. Oranges. Peaches. Plums. Pomegranate.  Meats and other protein foods: Beans. Almonds. Sunflower seeds. Pine nuts. Peanuts. [place]. Salmon. Scallops. Shrimp. [place]. Tilapia. Clams. Oysters. Eggs.  Dairy: Low-fat milk. Cheese. Greek yogurt.  Beverages: Water. Red wine. Herbal tea.  Fats and oils: Extra virgin olive oil. Avocado oil. Grape seed oil.  Sweets and desserts: [place] yogurt with honey. Baked apples. Poached pears. Trail mix.  Seasoning and other foods: Basil. Cilantro. Coriander. Cumin. Mint. Parsley. Sage. Rosemary. Tarragon. Garlic. Oregano. Thyme. Pepper. Balsalmic vinegar. Tahini. Hummus. Tomato sauce. Olives. Mushrooms. ? Limit these  Grains: Prepackaged pasta or rice dishes. Prepackaged cereal with added sugar.  Vegetables: Deep fried potatoes (french fries).  Fruits: Fruit canned in syrup.  Meats and other protein foods: Beef. Pork. Lamb. Poultry with skin. Hot dogs. [place] Salines.  Dairy: Ice cream. Sour cream. Whole milk.  Beverages: Juice. Sugar-sweetened soft drinks. Beer. Liquor and spirits.  Fats and oils: Butter. Canola oil. Vegetable oil. Beef fat (tallow). Lard.  Sweets and desserts: Cookies. Cakes. Pies. Candy.  Seasoning and other foods: Mayonnaise. Premade sauces and marinades. The items listed may not be a complete list. Talk with your dietitian about what dietary choices are right for you. Summary  The Mediterranean diet includes both food and lifestyle choices.  Eat a variety of fresh fruits and vegetables, beans, nuts, seeds, and whole grains.  Limit the amount of red meat and sweets that you eat.  Talk with your health care provider about whether it is safe for you to drink red wine in moderation. This means 1 glass a day for nonpregnant women and 2 glasses a day for men. A glass of wine equals 5 oz (150 mL). This information  is not intended to replace advice given to you by your health care provider. Make sure you discuss any questions you have with your health care provider. Document Released: 04/06/2016 Document Revised: 04/13/2016 Document Reviewed: 04/06/2016 Elsevier Patient Education  [place].   Increase water intake, strive for at least half your body weight in ounces per day. Follow Mediterranean diet Increase regular exercise.  Recommend at least 30 minutes daily, 5 days per week of walking, biking, swimming, YouTube/Pinterest workout videos. Referral to [facility]. When you need refills on your medications, please tell your pharmacy to send to our clinic for refill. Continue to social distance and wear a mask when in public. Schedule physical in 4 months, fasting labs the week prior. WELCOME TO THE PRACTICE!

## 2019-03-24 NOTE — Assessment & Plan Note (Signed)
Increase water intake, strive for at least half your body weight in ounces per day. Follow Mediterranean diet Increase regular exercise.  Recommend at least 30 minutes daily, 5 days per week of walking, biking, swimming, YouTube/Pinterest workout videos. Referral to [facility]. When you need refills on your medications, please tell your pharmacy to send to our clinic for refill. Continue to social distance and wear a mask when in public. Schedule physical in 4 months, fasting labs the week prior.

## 2019-03-24 NOTE — Assessment & Plan Note (Signed)
Body mass index is 46.02 kg/m.  Current 272 Mediterranean diet Increase daily walking Referral to [facility]

## 2019-05-07 ENCOUNTER — Encounter: Payer: Self-pay | Admitting: Adult Health

## 2019-07-03 ENCOUNTER — Other Ambulatory Visit: Payer: Self-pay

## 2019-07-03 ENCOUNTER — Other Ambulatory Visit: Payer: BC Managed Care – PPO

## 2019-07-03 DIAGNOSIS — R7303 Prediabetes: Secondary | ICD-10-CM

## 2019-07-03 DIAGNOSIS — Z0000 Encounter for general adult medical examination without abnormal findings: Secondary | ICD-10-CM

## 2019-07-03 DIAGNOSIS — I10 Essential (primary) hypertension: Secondary | ICD-10-CM | POA: Diagnosis not present

## 2019-07-03 DIAGNOSIS — R7309 Other abnormal glucose: Secondary | ICD-10-CM

## 2019-07-03 HISTORY — DX: Other abnormal glucose: R73.09

## 2019-07-04 LAB — LIPID PANEL
Chol/HDL Ratio: 3.7 ratio (ref 0.0–4.4)
Cholesterol, Total: 202 mg/dL — ABNORMAL HIGH (ref 100–199)
HDL: 54 mg/dL (ref >39)
LDL Chol Calc (NIH): 125 mg/dL — ABNORMAL HIGH (ref 0–99)
Triglycerides: 129 mg/dL (ref 0–149)
VLDL Cholesterol Cal: 23 mg/dL (ref 5–40)

## 2019-07-04 LAB — CBC WITH DIFFERENTIAL/PLATELET
Basophils Absolute: 0.1 10*3/uL (ref 0.0–0.2)
Basos: 1 %
EOS (ABSOLUTE): 0.3 10*3/uL (ref 0.0–0.4)
Eos: 4 %
Hematocrit: 36.9 % (ref 34.0–46.6)
Hemoglobin: 12.1 g/dL (ref 11.1–15.9)
Immature Grans (Abs): 0 10*3/uL (ref 0.0–0.1)
Immature Granulocytes: 1 %
Lymphocytes Absolute: 1.7 10*3/uL (ref 0.7–3.1)
Lymphs: 28 %
MCH: 26.9 pg (ref 26.6–33.0)
MCHC: 32.8 g/dL (ref 31.5–35.7)
MCV: 82 fL (ref 79–97)
Monocytes Absolute: 0.4 10*3/uL (ref 0.1–0.9)
Monocytes: 6 %
Neutrophils Absolute: 3.7 10*3/uL (ref 1.4–7.0)
Neutrophils: 60 %
Platelets: 250 10*3/uL (ref 150–450)
RBC: 4.5 x10E6/uL (ref 3.77–5.28)
RDW: 13.8 % (ref 11.7–15.4)
WBC: 6.1 10*3/uL (ref 3.4–10.8)

## 2019-07-04 LAB — COMPREHENSIVE METABOLIC PANEL
ALT: 97 IU/L — ABNORMAL HIGH (ref 0–32)
AST: 85 IU/L — ABNORMAL HIGH (ref 0–40)
Albumin/Globulin Ratio: 1.7 (ref 1.2–2.2)
Albumin: 4 g/dL (ref 3.8–4.8)
Alkaline Phosphatase: 94 IU/L (ref 39–117)
BUN/Creatinine Ratio: 24 — ABNORMAL HIGH (ref 9–23)
BUN: 22 mg/dL (ref 6–24)
Bilirubin Total: 0.4 mg/dL (ref 0.0–1.2)
CO2: 23 mmol/L (ref 20–29)
Calcium: 9.4 mg/dL (ref 8.7–10.2)
Chloride: 105 mmol/L (ref 96–106)
Creatinine, Ser: 0.92 mg/dL (ref 0.57–1.00)
GFR calc Af Amer: 85 mL/min/{1.73_m2} (ref >59)
GFR calc non Af Amer: 74 mL/min/{1.73_m2} (ref >59)
Globulin, Total: 2.4 g/dL (ref 1.5–4.5)
Glucose: 118 mg/dL — ABNORMAL HIGH (ref 65–99)
Potassium: 4 mmol/L (ref 3.5–5.2)
Sodium: 141 mmol/L (ref 134–144)
Total Protein: 6.4 g/dL (ref 6.0–8.5)

## 2019-07-04 LAB — HEMOGLOBIN A1C
Est. average glucose Bld gHb Est-mCnc: 134 mg/dL
Hgb A1c MFr Bld: 6.3 % — ABNORMAL HIGH (ref 4.8–5.6)

## 2019-07-04 LAB — TSH: TSH: 2.25 u[IU]/mL (ref 0.450–4.500)

## 2019-07-09 ENCOUNTER — Encounter: Payer: Self-pay | Admitting: Adult Health

## 2019-07-09 ENCOUNTER — Ambulatory Visit (INDEPENDENT_AMBULATORY_CARE_PROVIDER_SITE_OTHER): Payer: BC Managed Care – PPO | Admitting: Adult Health

## 2019-07-09 ENCOUNTER — Other Ambulatory Visit: Payer: Self-pay

## 2019-07-09 VITALS — BP 127/79 | HR 84 | Temp 99.3°F | Ht 64.75 in | Wt 275.6 lb

## 2019-07-09 DIAGNOSIS — E7800 Pure hypercholesterolemia, unspecified: Secondary | ICD-10-CM | POA: Diagnosis not present

## 2019-07-09 DIAGNOSIS — I10 Essential (primary) hypertension: Secondary | ICD-10-CM | POA: Diagnosis not present

## 2019-07-09 DIAGNOSIS — Z6841 Body Mass Index (BMI) 40.0 and over, adult: Secondary | ICD-10-CM

## 2019-07-09 DIAGNOSIS — R7989 Other specified abnormal findings of blood chemistry: Secondary | ICD-10-CM

## 2019-07-09 DIAGNOSIS — R7303 Prediabetes: Secondary | ICD-10-CM | POA: Diagnosis not present

## 2019-07-09 DIAGNOSIS — Z0000 Encounter for general adult medical examination without abnormal findings: Secondary | ICD-10-CM

## 2019-07-09 NOTE — Assessment & Plan Note (Signed)
She has gained 3 lbs since OV in July Current wt 275 Body mass index is 46.22 kg/m.  She denies GI sx's or acute cardiac sx's. Lifestyle encouraged to reduce BG, LDL, and weight. She declined starting low dose Metformin to help lower BG and assist with weight loss.

## 2019-07-09 NOTE — Patient Instructions (Addendum)
Preventive Care for Adults, Female  A healthy lifestyle and preventive care can promote health and wellness. Preventive health guidelines for women include the following key practices.   A routine yearly physical is a good way to check with your health care provider about your health and preventive screening. It is a chance to share any concerns and updates on your health and to receive a thorough exam.   Visit your dentist for a routine exam and preventive care every 6 months. Brush your teeth twice a day and floss once a day. Good oral hygiene prevents tooth decay and gum disease.   The frequency of eye exams is based on your age, health, family medical history, use of contact lenses, and other factors. Follow your health care provider's recommendations for frequency of eye exams.   Eat a healthy diet. Foods like vegetables, fruits, whole grains, low-fat dairy products, and lean protein foods contain the nutrients you need without too many calories. Decrease your intake of foods high in solid fats, added sugars, and salt. Eat the right amount of calories for you.Get information about a proper diet from your health care provider, if necessary.   Regular physical exercise is one of the most important things you can do for your health. Most adults should get at least 150 minutes of moderate-intensity exercise (any activity that increases your heart rate and causes you to sweat) each week. In addition, most adults need muscle-strengthening exercises on 2 or more days a week.   Maintain a healthy weight. The body mass index (BMI) is a screening tool to identify possible weight problems. It provides an estimate of body fat based on height and weight. Your health care provider can find your BMI, and can help you achieve or maintain a healthy weight.For adults 20 years and older:   - A BMI below 18.5 is considered underweight.   - A BMI of 18.5 to 24.9 is normal.   - A BMI of 25 to 29.9 is  considered overweight.   - A BMI of 30 and above is considered obese.   Maintain normal blood lipids and cholesterol levels by exercising and minimizing your intake of trans and saturated fats.  Eat a balanced diet with plenty of fruit and vegetables. Blood tests for lipids and cholesterol should begin at age 20 and be repeated every 5 years minimum.  If your lipid or cholesterol levels are high, you are over 40, or you are at high risk for heart disease, you may need your cholesterol levels checked more frequently.Ongoing high lipid and cholesterol levels should be treated with medicines if diet and exercise are not working.   If you smoke, find out from your health care provider how to quit. If you do not use tobacco, do not start.   Lung cancer screening is recommended for adults aged 55-80 years who are at high risk for developing lung cancer because of a history of smoking. A yearly low-dose CT scan of the lungs is recommended for people who have at least a 30-pack-year history of smoking and are a current smoker or have quit within the past 15 years. A pack year of smoking is smoking an average of 1 pack of cigarettes a day for 1 year (for example: 1 pack a day for 30 years or 2 packs a day for 15 years). Yearly screening should continue until the smoker has stopped smoking for at least 15 years. Yearly screening should be stopped for people who develop a   health problem that would prevent them from having lung cancer treatment.   If you are pregnant, do not drink alcohol. If you are breastfeeding, be very cautious about drinking alcohol. If you are not pregnant and choose to drink alcohol, do not have more than 1 drink per day. One drink is considered to be 12 ounces (355 mL) of beer, 5 ounces (148 mL) of wine, or 1.5 ounces (44 mL) of liquor.   Avoid use of street drugs. Do not share needles with anyone. Ask for help if you need support or instructions about stopping the use of  drugs.   High blood pressure causes heart disease and increases the risk of stroke. Your blood pressure should be checked at least yearly.  Ongoing high blood pressure should be treated with medicines if weight loss and exercise do not work.   If you are 69-55 years old, ask your health care provider if you should take aspirin to prevent strokes.   Diabetes screening involves taking a blood sample to check your fasting blood sugar level. This should be done once every 3 years, after age 38, if you are within normal weight and without risk factors for diabetes. Testing should be considered at a younger age or be carried out more frequently if you are overweight and have at least 1 risk factor for diabetes.   Breast cancer screening is essential preventive care for women. You should practice "breast self-awareness."  This means understanding the normal appearance and feel of your breasts and may include breast self-examination.  Any changes detected, no matter how small, should be reported to a health care provider.  Women in their 80s and 30s should have a clinical breast exam (CBE) by a health care provider as part of a regular health exam every 1 to 3 years.  After age 66, women should have a CBE every year.  Starting at age 1, women should consider having a mammogram (breast X-ray test) every year.  Women who have a family history of breast cancer should talk to their health care provider about genetic screening.  Women at a high risk of breast cancer should talk to their health care providers about having an MRI and a mammogram every year.   -Breast cancer gene (BRCA)-related cancer risk assessment is recommended for women who have family members with BRCA-related cancers. BRCA-related cancers include breast, ovarian, tubal, and peritoneal cancers. Having family members with these cancers may be associated with an increased risk for harmful changes (mutations) in the breast cancer genes BRCA1 and  BRCA2. Results of the assessment will determine the need for genetic counseling and BRCA1 and BRCA2 testing.   The Pap test is a screening test for cervical cancer. A Pap test can show cell changes on the cervix that might become cervical cancer if left untreated. A Pap test is a procedure in which cells are obtained and examined from the lower end of the uterus (cervix).   - Women should have a Pap test starting at age 57.   - Between ages 90 and 70, Pap tests should be repeated every 2 years.   - Beginning at age 63, you should have a Pap test every 3 years as long as the past 3 Pap tests have been normal.   - Some women have medical problems that increase the chance of getting cervical cancer. Talk to your health care provider about these problems. It is especially important to talk to your health care provider if a  new problem develops soon after your last Pap test. In these cases, your health care provider may recommend more frequent screening and Pap tests.   - The above recommendations are the same for women who have or have not gotten the vaccine for human papillomavirus (HPV).   - If you had a hysterectomy for a problem that was not cancer or a condition that could lead to cancer, then you no longer need Pap tests. Even if you no longer need a Pap test, a regular exam is a good idea to make sure no other problems are starting.   - If you are between ages 36 and 66 years, and you have had normal Pap tests going back 10 years, you no longer need Pap tests. Even if you no longer need a Pap test, a regular exam is a good idea to make sure no other problems are starting.   - If you have had past treatment for cervical cancer or a condition that could lead to cancer, you need Pap tests and screening for cancer for at least 20 years after your treatment.   - If Pap tests have been discontinued, risk factors (such as a new sexual partner) need to be reassessed to determine if screening should  be resumed.   - The HPV test is an additional test that may be used for cervical cancer screening. The HPV test looks for the virus that can cause the cell changes on the cervix. The cells collected during the Pap test can be tested for HPV. The HPV test could be used to screen women aged 70 years and older, and should be used in women of any age who have unclear Pap test results. After the age of 67, women should have HPV testing at the same frequency as a Pap test.   Colorectal cancer can be detected and often prevented. Most routine colorectal cancer screening begins at the age of 57 years and continues through age 26 years. However, your health care provider may recommend screening at an earlier age if you have risk factors for colon cancer. On a yearly basis, your health care provider may provide home test kits to check for hidden blood in the stool.  Use of a small camera at the end of a tube, to directly examine the colon (sigmoidoscopy or colonoscopy), can detect the earliest forms of colorectal cancer. Talk to your health care provider about this at age 23, when routine screening begins. Direct exam of the colon should be repeated every 5 -10 years through age 49 years, unless early forms of pre-cancerous polyps or small growths are found.   People who are at an increased risk for hepatitis B should be screened for this virus. You are considered at high risk for hepatitis B if:  -You were born in a country where hepatitis B occurs often. Talk with your health care provider about which countries are considered high risk.  - Your parents were born in a high-risk country and you have not received a shot to protect against hepatitis B (hepatitis B vaccine).  - You have HIV or AIDS.  - You use needles to inject street drugs.  - You live with, or have sex with, someone who has Hepatitis B.  - You get hemodialysis treatment.  - You take certain medicines for conditions like cancer, organ  transplantation, and autoimmune conditions.   Hepatitis C blood testing is recommended for all people born from [birth year] through 1965 and any individual  with known risks for hepatitis C.   Practice safe sex. Use condoms and avoid high-risk sexual practices to reduce the spread of sexually transmitted infections (STIs). STIs include gonorrhea, chlamydia, syphilis, trichomonas, herpes, HPV, and human immunodeficiency virus (HIV). Herpes, HIV, and HPV are viral illnesses that have no cure. They can result in disability, cancer, and death. Sexually active women aged 25 years and younger should be checked for chlamydia. Older women with new or multiple partners should also be tested for chlamydia. Testing for other STIs is recommended if you are sexually active and at increased risk.   Osteoporosis is a disease in which the bones lose minerals and strength with aging. This can result in serious bone fractures or breaks. The risk of osteoporosis can be identified using a bone density scan. Women ages 65 years and over and women at risk for fractures or osteoporosis should discuss screening with their health care providers. Ask your health care provider whether you should take a calcium supplement or vitamin D to There are also several preventive steps women can take to avoid osteoporosis and resulting fractures or to keep osteoporosis from worsening. -->Recommendations include:  Eat a balanced diet high in fruits, vegetables, calcium, and vitamins.  Get enough calcium. The recommended total intake of is 1,200 mg daily; for best absorption, if taking supplements, divide doses into 250-500 mg doses throughout the day. Of the two types of calcium, calcium carbonate is best absorbed when taken with food but calcium citrate can be taken on an empty stomach.  Get enough vitamin D. NAMS and the National Osteoporosis Foundation recommend at least 1,000 IU per day for women age 50 and over who are at risk of vitamin D  deficiency. Vitamin D deficiency can be caused by inadequate sun exposure (for example, those who live in northern latitudes).  Avoid alcohol and smoking. Heavy alcohol intake (more than 7 drinks per week) increases the risk of falls and hip fracture and women smokers tend to lose bone more rapidly and have lower bone mass than nonsmokers. Stopping smoking is one of the most important changes women can make to improve their health and decrease risk for disease.  Be physically active every day. Weight-bearing exercise (for example, fast walking, hiking, jogging, and weight training) may strengthen bones or slow the rate of bone loss that comes with aging. Balancing and muscle-strengthening exercises can reduce the risk of falling and fracture.  Consider therapeutic medications. Currently, several types of effective drugs are available. Healthcare providers can recommend the type most appropriate for each woman.  Eliminate environmental factors that may contribute to accidents. Falls cause nearly 90% of all osteoporotic fractures, so reducing this risk is an important bone-health strategy. Measures include ample lighting, removing obstructions to walking, using nonskid rugs on floors, and placing mats and/or grab bars in showers.  Be aware of medication side effects. Some common medicines make bones weaker. These include a type of steroid drug called glucocorticoids used for arthritis and asthma, some antiseizure drugs, certain sleeping pills, treatments for endometriosis, and some cancer drugs. An overactive thyroid gland or using too much thyroid hormone for an underactive thyroid can also be a problem. If you are taking these medicines, talk to your doctor about what you can do to help protect your bones.reduce the rate of osteoporosis.    Menopause can be associated with physical symptoms and risks. Hormone replacement therapy is available to decrease symptoms and risks. You should talk to your  health care provider   about whether hormone replacement therapy is right for you.   Use sunscreen. Apply sunscreen liberally and repeatedly throughout the day. You should seek shade when your shadow is shorter than you. Protect yourself by wearing long sleeves, pants, a wide-brimmed hat, and sunglasses year round, whenever you are outdoors.   Once a month, do a whole body skin exam, using a mirror to look at the skin on your back. Tell your health care provider of new moles, moles that have irregular borders, moles that are larger than a pencil eraser, or moles that have changed in shape or color.   -Stay current with required vaccines (immunizations).   Influenza vaccine. All adults should be immunized every year.  Tetanus, diphtheria, and acellular pertussis (Td, Tdap) vaccine. Pregnant women should receive 1 dose of Tdap vaccine during each pregnancy. The dose should be obtained regardless of the length of time since the last dose. Immunization is preferred during the 27th 36th week of gestation. An adult who has not previously received Tdap or who does not know her vaccine status should receive 1 dose of Tdap. This initial dose should be followed by tetanus and diphtheria toxoids (Td) booster doses every 10 years. Adults with an unknown or incomplete history of completing a 3-dose immunization series with Td-containing vaccines should begin or complete a primary immunization series including a Tdap dose. Adults should receive a Td booster every 10 years.  Varicella vaccine. An adult without evidence of immunity to varicella should receive 2 doses or a second dose if she has previously received 1 dose. Pregnant females who do not have evidence of immunity should receive the first dose after pregnancy. This first dose should be obtained before leaving the health care facility. The second dose should be obtained 4 8 weeks after the first dose.  Human papillomavirus (HPV) vaccine. Females aged 13 26  years who have not received the vaccine previously should obtain the 3-dose series. The vaccine is not recommended for use in pregnant females. However, pregnancy testing is not needed before receiving a dose. If a female is found to be pregnant after receiving a dose, no treatment is needed. In that case, the remaining doses should be delayed until after the pregnancy. Immunization is recommended for any person with an immunocompromised condition through the age of 26 years if she did not get any or all doses earlier. During the 3-dose series, the second dose should be obtained 4 8 weeks after the first dose. The third dose should be obtained 24 weeks after the first dose and 16 weeks after the second dose.  Zoster vaccine. One dose is recommended for adults aged 60 years or older unless certain conditions are present.  Measles, mumps, and rubella (MMR) vaccine. Adults born before 1957 generally are considered immune to measles and mumps. Adults born in 1957 or later should have 1 or more doses of MMR vaccine unless there is a contraindication to the vaccine or there is laboratory evidence of immunity to each of the three diseases. A routine second dose of MMR vaccine should be obtained at least 28 days after the first dose for students attending postsecondary schools, health care workers, or international travelers. People who received inactivated measles vaccine or an unknown type of measles vaccine during 1963 1967 should receive 2 doses of MMR vaccine. People who received inactivated mumps vaccine or an unknown type of mumps vaccine before 1979 and are at high risk for mumps infection should consider immunization with 2 doses of   MMR vaccine. For females of childbearing age, rubella immunity should be determined. If there is no evidence of immunity, females who are not pregnant should be vaccinated. If there is no evidence of immunity, females who are pregnant should delay immunization until after pregnancy.  Unvaccinated health care workers born before [birth year] who lack laboratory evidence of measles, mumps, or rubella immunity or laboratory confirmation of disease should consider measles and mumps immunization with 2 doses of MMR vaccine or rubella immunization with 1 dose of MMR vaccine.  Pneumococcal 13-valent conjugate (PCV13) vaccine. When indicated, a person who is uncertain of her immunization history and has no record of immunization should receive the PCV13 vaccine. An adult aged 54 years or older who has certain medical conditions and has not been previously immunized should receive 1 dose of PCV13 vaccine. This PCV13 should be followed with a dose of pneumococcal polysaccharide (PPSV23) vaccine. The PPSV23 vaccine dose should be obtained at least 8 weeks after the dose of PCV13 vaccine. An adult aged 58 years or older who has certain medical conditions and previously received 1 or more doses of PPSV23 vaccine should receive 1 dose of PCV13. The PCV13 vaccine dose should be obtained 1 or more years after the last PPSV23 vaccine dose.  Pneumococcal polysaccharide (PPSV23) vaccine. When PCV13 is also indicated, PCV13 should be obtained first. All adults aged 58 years and older should be immunized. An adult younger than age 65 years who has certain medical conditions should be immunized. Any person who resides in a nursing home or long-term care facility should be immunized. An adult smoker should be immunized. People with an immunocompromised condition and certain other conditions should receive both PCV13 and PPSV23 vaccines. People with human immunodeficiency virus (HIV) infection should be immunized as soon as possible after diagnosis. Immunization during chemotherapy or radiation therapy should be avoided. Routine use of PPSV23 vaccine is not recommended for American Indians, [place] Natives, or people younger than 65 years unless there are medical conditions that require PPSV23 vaccine. When indicated,  people who have unknown immunization and have no record of immunization should receive PPSV23 vaccine. One-time revaccination 5 years after the first dose of PPSV23 is recommended for people aged 70 64 years who have chronic kidney failure, nephrotic syndrome, asplenia, or immunocompromised conditions. People who received 1 2 doses of PPSV23 before age 32 years should receive another dose of PPSV23 vaccine at age 96 years or later if at least 5 years have passed since the previous dose. Doses of PPSV23 are not needed for people immunized with PPSV23 at or after age 55 years.  Meningococcal vaccine. Adults with asplenia or persistent complement component deficiencies should receive 2 doses of quadrivalent meningococcal conjugate (MenACWY-D) vaccine. The doses should be obtained at least 2 months apart. Microbiologists working with certain meningococcal bacteria, [employer] recruits, people at risk during an outbreak, and people who travel to or live in countries with a high rate of meningitis should be immunized. A first-year college student up through age 58 years who is living in a residence hall should receive a dose if she did not receive a dose on or after her 16th birthday. Adults who have certain high-risk conditions should receive one or more doses of vaccine.  Hepatitis A vaccine. Adults who wish to be protected from this disease, have certain high-risk conditions, work with hepatitis A-infected animals, work in hepatitis A research labs, or travel to or work in countries with a high rate of hepatitis A should be  immunized. Adults who were previously unvaccinated and who anticipate close contact with an international adoptee during the first 60 days after arrival in the [place] from a country with a high rate of hepatitis A should be immunized.  Hepatitis B vaccine.  Adults who wish to be protected from this disease, have certain high-risk conditions, may be exposed to blood or other infectious  body fluids, are household contacts or sex partners of hepatitis B positive people, are clients or workers in certain care facilities, or travel to or work in countries with a high rate of hepatitis B should be immunized.  Haemophilus influenzae type b (Hib) vaccine. A previously unvaccinated person with asplenia or sickle cell disease or having a scheduled splenectomy should receive 1 dose of Hib vaccine. Regardless of previous immunization, a recipient of a hematopoietic stem cell transplant should receive a 3-dose series 6 12 months after her successful transplant. Hib vaccine is not recommended for adults with HIV infection.  Preventive Services / Frequency Ages 6 to 39years  Blood pressure check.** / Every 1 to 2 years.  Lipid and cholesterol check.** / Every 5 years beginning at age 39.  Clinical breast exam.** / Every 3 years for women in their 61s and 62s.  BRCA-related cancer risk assessment.** / For women who have family members with a BRCA-related cancer (breast, ovarian, tubal, or peritoneal cancers).  Pap test.** / Every 2 years from ages 47 through 85. Every 3 years starting at age 34 through age 12 or 74 with a history of 3 consecutive normal Pap tests.  HPV screening.** / Every 3 years from ages 46 through ages 43 to 54 with a history of 3 consecutive normal Pap tests.  Hepatitis C blood test.** / For any individual with known risks for hepatitis C.  Skin self-exam. / Monthly.  Influenza vaccine. / Every year.  Tetanus, diphtheria, and acellular pertussis (Tdap, Td) vaccine.** / Consult your health care provider. Pregnant women should receive 1 dose of Tdap vaccine during each pregnancy. 1 dose of Td every 10 years.  Varicella vaccine.** / Consult your health care provider. Pregnant females who do not have evidence of immunity should receive the first dose after pregnancy.  HPV vaccine. / 3 doses over 6 months, if 64 and younger. The vaccine is not recommended for use in  pregnant females. However, pregnancy testing is not needed before receiving a dose.  Measles, mumps, rubella (MMR) vaccine.** / You need at least 1 dose of MMR if you were born in 1957 or later. You may also need a 2nd dose. For females of childbearing age, rubella immunity should be determined. If there is no evidence of immunity, females who are not pregnant should be vaccinated. If there is no evidence of immunity, females who are pregnant should delay immunization until after pregnancy.  Pneumococcal 13-valent conjugate (PCV13) vaccine.** / Consult your health care provider.  Pneumococcal polysaccharide (PPSV23) vaccine.** / 1 to 2 doses if you smoke cigarettes or if you have certain conditions.  Meningococcal vaccine.** / 1 dose if you are age 71 to 37 years and a Market researcher living in a residence hall, or have one of several medical conditions, you need to get vaccinated against meningococcal disease. You may also need additional booster doses.  Hepatitis A vaccine.** / Consult your health care provider.  Hepatitis B vaccine.** / Consult your health care provider.  Haemophilus influenzae type b (Hib) vaccine.** / Consult your health care provider.  Ages 55 to 64years  Blood pressure check.** / Every 1 to 2 years.  Lipid and cholesterol check.** / Every 5 years beginning at age 20 years.  Lung cancer screening. / Every year if you are aged 55 80 years and have a 30-pack-year history of smoking and currently smoke or have quit within the past 15 years. Yearly screening is stopped once you have quit smoking for at least 15 years or develop a health problem that would prevent you from having lung cancer treatment.  Clinical breast exam.** / Every year after age 40 years.  BRCA-related cancer risk assessment.** / For women who have family members with a BRCA-related cancer (breast, ovarian, tubal, or peritoneal cancers).  Mammogram.** / Every year beginning at age 40  years and continuing for as long as you are in good health. Consult with your health care provider.  Pap test.** / Every 3 years starting at age 30 years through age 65 or 70 years with a history of 3 consecutive normal Pap tests.  HPV screening.** / Every 3 years from ages 30 years through ages 65 to 70 years with a history of 3 consecutive normal Pap tests.  Fecal occult blood test (FOBT) of stool. / Every year beginning at age 50 years and continuing until age 75 years. You may not need to do this test if you get a colonoscopy every 10 years.  Flexible sigmoidoscopy or colonoscopy.** / Every 5 years for a flexible sigmoidoscopy or every 10 years for a colonoscopy beginning at age 50 years and continuing until age 75 years.  Hepatitis C blood test.** / For all people born from 1945 through 1965 and any individual with known risks for hepatitis C.  Skin self-exam. / Monthly.  Influenza vaccine. / Every year.  Tetanus, diphtheria, and acellular pertussis (Tdap/Td) vaccine.** / Consult your health care provider. Pregnant women should receive 1 dose of Tdap vaccine during each pregnancy. 1 dose of Td every 10 years.  Varicella vaccine.** / Consult your health care provider. Pregnant females who do not have evidence of immunity should receive the first dose after pregnancy.  Zoster vaccine.** / 1 dose for adults aged 60 years or older.  Measles, mumps, rubella (MMR) vaccine.** / You need at least 1 dose of MMR if you were born in 1957 or later. You may also need a 2nd dose. For females of childbearing age, rubella immunity should be determined. If there is no evidence of immunity, females who are not pregnant should be vaccinated. If there is no evidence of immunity, females who are pregnant should delay immunization until after pregnancy.  Pneumococcal 13-valent conjugate (PCV13) vaccine.** / Consult your health care provider.  Pneumococcal polysaccharide (PPSV23) vaccine.** / 1 to 2 doses if  you smoke cigarettes or if you have certain conditions.  Meningococcal vaccine.** / Consult your health care provider.  Hepatitis A vaccine.** / Consult your health care provider.  Hepatitis B vaccine.** / Consult your health care provider.  Haemophilus influenzae type b (Hib) vaccine.** / Consult your health care provider.  Ages 65 years and over  Blood pressure check.** / Every 1 to 2 years.  Lipid and cholesterol check.** / Every 5 years beginning at age 20 years.  Lung cancer screening. / Every year if you are aged 55 80 years and have a 30-pack-year history of smoking and currently smoke or have quit within the past 15 years. Yearly screening is stopped once you have quit smoking for at least 15 years or develop a health problem that   would prevent you from having lung cancer treatment.  Clinical breast exam.** / Every year after age 103 years.  BRCA-related cancer risk assessment.** / For women who have family members with a BRCA-related cancer (breast, ovarian, tubal, or peritoneal cancers).  Mammogram.** / Every year beginning at age 36 years and continuing for as long as you are in good health. Consult with your health care provider.  Pap test.** / Every 3 years starting at age 5 years through age 85 or 10 years with 3 consecutive normal Pap tests. Testing can be stopped between 65 and 70 years with 3 consecutive normal Pap tests and no abnormal Pap or HPV tests in the past 10 years.  HPV screening.** / Every 3 years from ages 93 years through ages 70 or 45 years with a history of 3 consecutive normal Pap tests. Testing can be stopped between 65 and 70 years with 3 consecutive normal Pap tests and no abnormal Pap or HPV tests in the past 10 years.  Fecal occult blood test (FOBT) of stool. / Every year beginning at age 8 years and continuing until age 45 years. You may not need to do this test if you get a colonoscopy every 10 years.  Flexible sigmoidoscopy or colonoscopy.** /  Every 5 years for a flexible sigmoidoscopy or every 10 years for a colonoscopy beginning at age 69 years and continuing until age 68 years.  Hepatitis C blood test.** / For all people born from [birth year] through 1965 and any individual with known risks for hepatitis C.  Osteoporosis screening.** / A one-time screening for women ages 7 years and over and women at risk for fractures or osteoporosis.  Skin self-exam. / Monthly.  Influenza vaccine. / Every year.  Tetanus, diphtheria, and acellular pertussis (Tdap/Td) vaccine.** / 1 dose of Td every 10 years.  Varicella vaccine.** / Consult your health care provider.  Zoster vaccine.** / 1 dose for adults aged 5 years or older.  Pneumococcal 13-valent conjugate (PCV13) vaccine.** / Consult your health care provider.  Pneumococcal polysaccharide (PPSV23) vaccine.** / 1 dose for all adults aged 74 years and older.  Meningococcal vaccine.** / Consult your health care provider.  Hepatitis A vaccine.** / Consult your health care provider.  Hepatitis B vaccine.** / Consult your health care provider.  Haemophilus influenzae type b (Hib) vaccine.** / Consult your health care provider. ** Family history and personal history of risk and conditions may change your health care provider's recommendations. Document Released: 10/10/2001 Document Revised: 06/04/2013  [facility] Patient Information 2014 [place], [place].   EXERCISE AND DIET:  We recommended that you start or continue a regular exercise program for good health. Regular exercise means any activity that makes your heart beat faster and makes you sweat.  We recommend exercising at least 30 minutes per day at least 3 days a week, preferably 5.  We also recommend a diet low in fat and sugar / carbohydrates.  Inactivity, poor dietary choices and obesity can cause diabetes, heart attack, stroke, and kidney damage, among others.     ALCOHOL AND SMOKING:  Women should limit their alcohol intake to no  more than 7 drinks/beers/glasses of wine (combined, not each!) per week. Moderation of alcohol intake to this level decreases your risk of breast cancer and liver damage.  ( And of course, no recreational drugs are part of a healthy lifestyle.)  Also, you should not be smoking at all or even being exposed to second hand smoke. Most people know smoking can  cause cancer, and various heart and lung diseases, but did you know it also contributes to weakening of your bones?  Aging of your skin?  Yellowing of your teeth and nails?   CALCIUM AND VITAMIN D:  Adequate intake of calcium and Vitamin D are recommended.  The recommendations for exact amounts of these supplements seem to change often, but generally speaking 600 mg of calcium (either carbonate or citrate) and 800 units of Vitamin D per day seems prudent. Certain women may benefit from higher intake of Vitamin D.  If you are among these women, your doctor will have told you during your visit.     PAP SMEARS:  Pap smears, to check for cervical cancer or precancers,  have traditionally been done yearly, although recent scientific advances have shown that most women can have pap smears less often.  However, every woman still should have a physical exam from her gynecologist or primary care physician every year. It will include a breast check, inspection of the vulva and vagina to check for abnormal growths or skin changes, a visual exam of the cervix, and then an exam to evaluate the size and shape of the uterus and ovaries.  And after 48 years of age, a rectal exam is indicated to check for rectal cancers. We will also provide age appropriate advice regarding health maintenance, like when you should have certain vaccines, screening for sexually transmitted diseases, bone density testing, colonoscopy, mammograms, etc.    MAMMOGRAMS:  All women over 81 years old should have a yearly mammogram. Many facilities now offer a "3D" mammogram, which may cost  around $50 extra out of pocket. If possible,  we recommend you accept the option to have the 3D mammogram performed.  It both reduces the number of women who will be called back for extra views which then turn out to be normal, and it is better than the routine mammogram at detecting truly abnormal areas.     COLONOSCOPY:  Colonoscopy to screen for colon cancer is recommended for all women at age 45.  We know, you hate the idea of the prep.  We agree, BUT, having colon cancer and not knowing it is worse!!  Colon cancer so often starts as a polyp that can be seen and removed at colonscopy, which can quite literally save your life!  And if your first colonoscopy is normal and you have no family history of colon cancer, most women don't have to have it again for 10 years.  Once every ten years, you can do something that may end up saving your life, right?  We will be happy to help you get it scheduled when you are ready.  Be sure to check your insurance coverage so you understand how much it will cost.  It may be covered as a preventative service at no cost, but you should check your particular policy.    To help reduce blood sugar,cholesterol, and weight- follow a lower carbohydrate/cholesterol diet, and increase daily movement/exercise. Increase water intake, strive for at least for at least 90 oz/day. Increase regular exercise.  Recommend at least 30 minutes daily, 5 days per week of walking, jogging, biking, swimming, YouTube/Pinterest workout videos. Recommend follow-up in 3 months- please come fasting and we will re-check A1c, cholesterol panel, and liver function. Continue to social distance and wear a mask when in public. GREAT TO SEE YOU!

## 2019-07-09 NOTE — Assessment & Plan Note (Signed)
The 10-year ASCVD risk score Mikey Bussing DC Brooke Bonito., et al., 2013) is: 2.8%   Values used to calculate the score:     Age: 48 years     Sex: Female     Is Non-Hispanic African American: No     Diabetic: Yes     Tobacco smoker: No     Systolic Blood Pressure: [id number] mmHg     Is BP treated: Yes     HDL Cholesterol: 54 mg/dL     Total Cholesterol: 202 mg/dL LDL-125 Lifestyle emphasized  Re-check lipids in 3 months

## 2019-07-09 NOTE — Progress Notes (Addendum)
Subjective:    Patient ID: Chelsea Robbins, female    DOB: 10/06/1970, 48 y.o.   MRN: [id number]  HPI:  03/24/2019 OV: [name] is here to establish as a new pt.  She is a pleasant 48 year old female. 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  She would like to loss  75- 100 lbs  07/09/2019 OV: Ms. Chelsea Robbins is here for CPE. She has not resumed regular exercise in addition to "walking breaks from work". She has gained 3 lbs since OV in July Current wt 275 Body mass index is 46.22 kg/m.  She denies GI sx's or acute cardiac sx's. Lifestyle encouraged to reduce BG, LDL, and weight. She declined starting low dose Metformin to help lower BG and assist with weight loss.  07/03/2019 Labs: The 10-year ASCVD risk score Mikey Bussing DC Brooke Bonito., et al., 2013) is: 2.8%   Values used to calculate the score:     Age: 48 years     Sex: Female     Is Non-Hispanic African American: No     Diabetic: Yes     Tobacco smoker: No     Systolic Blood Pressure: [id number] mmHg     Is BP treated: Yes     HDL Cholesterol: 54 mg/dL     Total Cholesterol: 202 mg/dL  LDL-125 TSH-WNL, 2.250  A1c-6.3, increased from 6.0 five months ago  CBC-stable  CMP-elevated LFTs  AST-85  ALT-97  06/11/14  [place] ABD Lim-  IMPRESSION:  1. Echogenic liver parenchyma consistent with fatty infiltration.  Probable areas of sparing near the porta hepatis and gallbladder.  2. No gallstones.  The 10-year ASCVD risk score Mikey Bussing DC Brooke Bonito., et al., 2013) is: 2.9%  Values used to calculate the score:   Age: 48 years    Sex: Female   Is Non-Hispanic African American: No   Diabetic: Yes   Tobacco smoker: No   Systolic Blood Pressure: [id number] mmHg   Is BP treated: Yes   HDL Cholesterol: 54 mg/dL   Total Cholesterol: 202 mg/dL  LDL-125   Healthcare Maintenance: PAP-UTD, 09/09/2018 Mammogram-UTD, 08/06/2018 Immunizations-UTD Colonoscopy-N/A  Patient Care Team    Relationship Specialty Notifications Start End  Mina Marble D, NP PCP - General Family Medicine  03/24/19   Pedro Earls, MD Attending Physician Family Medicine  01/13/19   Ob/Gyn, Esm[facility] Plants    03/24/19     Patient Active Problem List   Diagnosis Date Noted  . Elevated LDL cholesterol level 07/09/2019  . Pre-diabetes 07/09/2019  . Healthcare maintenance 03/24/2019  . Diabetic peripheral neuropathy ([place]) 02/19/2019  . BMI 45.0-49.9, adult ([place]) 01/26/2018  . Irregular periods 01/26/2018  . Morbid obesity ([place]) 01/26/2018  . Pruritus ani 01/26/2018     Past Medical History:  Diagnosis Date  . Diabetes mellitus without complication (HCC)    prediabetes  . Diabetic peripheral neuropathy ([place]) 02/19/2019  . Elevated hemoglobin A1c   . Hypertension   . Neck pain   . Plantar fasciitis      Past Surgical History:  Procedure Laterality Date  .  CESAREAN SECTION    . FOOT SURGERY     Bilateral  . TONSILLECTOMY    . TUBAL LIGATION       Family History  Problem Relation Age of Onset  . Cancer Mother        GYN  . Drug abuse Father   . Cancer Father   . Cancer Maternal Aunt        breast, lung  . Cancer Maternal Grandmother        brain  . Cancer Maternal Grandfather        throat     Social History   Substance and Sexual Activity  Drug Use Not Currently     Social History   Substance and Sexual Activity  Alcohol Use Yes   Comment: Rarely      Social History   Tobacco Use  Smoking Status Never Smoker  Smokeless Tobacco Never Used     Outpatient Encounter Medications as of  07/09/2019  Medication Sig  . Cholecalciferol (VITAMIN D3 PO) Take by mouth. Takes between 2000 and 5000 units.  . Ferrous Sulfate (IRON) 325 (65 Fe) MG TABS Take by mouth. 1 tab per week  . triamterene-hydrochlorothiazide (MAXZIDE-25) 37.5-25 MG tablet Take 1 tablet by mouth every morning.   No facility-administered encounter medications on file as of 07/09/2019.    Allergies: Diclofenac  Body mass index is 46.22 kg/m.  Blood pressure 127/79, pulse 84, temperature 99.3 F (37.4 C), temperature source Oral, height 5' 4.75" (1.645 m), weight 275 lb 9.6 oz (125 kg), last menstrual period 06/30/2019, SpO2 98 %.     Review of Systems  Constitutional: Positive for fatigue. Negative for activity change, appetite change, chills, diaphoresis, fever and unexpected weight change.  HENT: Negative for congestion.   Eyes: Negative for visual disturbance.  Respiratory: Negative for cough, chest tightness, shortness of breath, wheezing and stridor.   Cardiovascular: Negative for chest pain, palpitations and leg swelling.  Gastrointestinal: Negative for abdominal distention, anal bleeding, blood in stool, constipation, diarrhea, nausea and vomiting.  Endocrine: Negative for polydipsia, polyphagia and polyuria.  Genitourinary: Negative for difficulty urinating and flank pain.  Musculoskeletal: Negative for arthralgias, back pain, gait problem, joint swelling, myalgias, neck pain and neck stiffness.  Neurological: Positive for weakness. Negative for dizziness and headaches.  Hematological: Negative for adenopathy. Does not bruise/bleed easily.  Psychiatric/Behavioral: Negative for agitation, behavioral problems, confusion, decreased concentration, dysphoric mood, hallucinations, self-injury, sleep disturbance and suicidal ideas. The patient is not nervous/anxious and is not hyperactive.        Objective:   Physical Exam Vitals signs and nursing note reviewed.  Constitutional:      General: She  is not in acute distress.    Appearance: She is obese. She is not ill-appearing, toxic-appearing or diaphoretic.  HENT:     Head: Normocephalic and atraumatic.     Right Ear: Tympanic membrane, ear canal and external ear normal. There is no impacted cerumen.     Left Ear: Tympanic membrane, ear canal and external ear normal. There is no impacted cerumen.     Nose: Nose normal. No congestion.     Mouth/Throat:     Mouth: Mucous membranes are moist.  Eyes:     Extraocular Movements: Extraocular movements intact.     Conjunctiva/sclera: Conjunctivae normal.     Pupils: Pupils are equal, round, and reactive to light.  Neck:     Musculoskeletal: Normal range of motion and neck supple.  Cardiovascular:  Rate and Rhythm: Normal rate and regular rhythm.     Pulses: Normal pulses.     Heart sounds: Normal heart sounds. No murmur. No friction rub. No gallop.   Pulmonary:     Effort: Pulmonary effort is normal. No respiratory distress.     Breath sounds: Normal breath sounds. No stridor. No wheezing, rhonchi or rales.  Chest:     Chest wall: No tenderness.  Abdominal:     General: Abdomen is protuberant. Bowel sounds are normal.     Palpations: Abdomen is soft.     Tenderness: There is no abdominal tenderness. There is no right CVA tenderness, left CVA tenderness, guarding or rebound.  Musculoskeletal: Normal range of motion.        General: No tenderness.  Skin:    General: Skin is warm.     Capillary Refill: Capillary refill takes less than 2 seconds.  Neurological:     Mental Status: She is alert and oriented to person, place, and time.  Psychiatric:        Mood and Affect: Mood normal.        Behavior: Behavior normal.        Thought Content: Thought content normal.        Judgment: Judgment normal.        Assessment & Plan:   1. Hypertension, unspecified type   2. Pre-diabetes   3. Elevated LDL cholesterol level   4. Elevated LFTs   5. BMI 45.0-49.9, adult ([place])   6.  Healthcare maintenance     BMI 45.0-49.9, adult [facility]) She has gained 3 lbs since OV in July Current wt 275 Body mass index is 46.22 kg/m.  She denies GI sx's or acute cardiac sx's. Lifestyle encouraged to reduce BG, LDL, and weight. She declined starting low dose Metformin to help lower BG and assist with weight loss.  Elevated LDL cholesterol level The 10-year ASCVD risk score Mikey Bussing DC Jr., et al., 2013) is: 2.8%   Values used to calculate the score:     Age: 89 years     Sex: Female     Is Non-Hispanic African American: No     Diabetic: Yes     Tobacco smoker: No     Systolic Blood Pressure: [id number] mmHg     Is BP treated: Yes     HDL Cholesterol: 54 mg/dL     Total Cholesterol: 202 mg/dL LDL-125 Lifestyle emphasized  Re-check lipids in 3 months   Pre-diabetes Lab Results  Component Value Date   HGBA1C 6.3 (H) 07/03/2019   HGBA1C 6.0 02/04/2019  Lifestyle encouraged Re-check A1c 3 months Pt declined low dose Metformin at this time   Healthcare maintenance To help reduce blood sugar,cholesterol, and weight- follow a lower carbohydrate/cholesterol diet, and increase daily movement/exercise. Increase water intake, strive for at least for at least 90 oz/day. Increase regular exercise.  Recommend at least 30 minutes daily, 5 days per week of walking, jogging, biking, swimming, YouTube/Pinterest workout videos. Recommend follow-up in 3 months- please come fasting and we will re-check A1c, cholesterol panel, and liver function. Continue to social distance and wear a mask when in public.    FOLLOW-UP:  Return in about 3 months (around 10/09/2019) for Regular Follow Up, HTN, Obesity, Hypercholestermia, A1c re-check.

## 2019-07-09 NOTE — Assessment & Plan Note (Signed)
Lab Results  Component Value Date   HGBA1C 6.3 (H) 07/03/2019   HGBA1C 6.0 02/04/2019  Lifestyle encouraged Re-check A1c 3 months Pt declined low dose Metformin at this time

## 2019-07-18 ENCOUNTER — Encounter: Payer: Self-pay | Admitting: Adult Health

## 2019-08-04 ENCOUNTER — Ambulatory Visit: Payer: BLUE CROSS/BLUE SHIELD | Admitting: Neurology

## 2019-08-12 NOTE — Assessment & Plan Note (Signed)
To help reduce blood sugar,cholesterol, and weight- follow a lower carbohydrate/cholesterol diet, and increase daily movement/exercise. Increase water intake, strive for at least for at least 90 oz/day. Increase regular exercise.  Recommend at least 30 minutes daily, 5 days per week of walking, jogging, biking, swimming, YouTube/Pinterest workout videos. Recommend follow-up in 3 months- please come fasting and we will re-check A1c, cholesterol panel, and liver function. Continue to social distance and wear a mask when in public.

## 2019-09-09 LAB — HM MAMMOGRAPHY

## 2019-09-09 NOTE — Progress Notes (Signed)
PATIENT: Chelsea Robbins DOB: Jul 16, 1971  REASON FOR VISIT: follow up HISTORY FROM: patient  HISTORY OF PRESENT ILLNESS: Today 09/10/19  Ms. Chelsea Robbins is a 49 year old female with history of numbness and tingling in her toes and balls of the feet.  Nerve conduction evaluation showed mild sensory changes, that could be related to her peripheral neuropathy.  She has borderline diabetes, her last A1c was 6.0.  Laboratory evaluation was unremarkable (B12, sed rate, angiotensin-converting enzyme, ANA, multiple myeloma panel, B burgdorferi antibodies).  She denies any pain associated with neuropathy.  She is not on any medications for neuropathy.  She is borderline diabetic, her recent A1c was 6.3.  She is trying to get back to exercising, dieting for weight loss.  She reports some numbness in her feet, she may have an occasional pain sensation.  The sensation is mostly in her feet, but if she is standing still, the sensation go up her leg.  In general when she is moving she is good.  She has not had any falls or trouble with balance.  She reports that cold and heat may exacerbate her symptoms.  She has a sensation that a sock is balled up to the arch of her foot.  She works as an Optometrist.  She has routine follow-up with her primary provider.  She presents today for follow-up unaccompanied.  HISTORY 01/13/2019 Dr. Jannifer Franklin: Chelsea Robbins Has Bares is a 49 year old right-handed white female with a history of onset of numbness and tingling in the toes and ball the feet in November 2019.  Over time, this has become more prominent.  The patient reports that she does have some left-sided neck discomfort and left shoulder pain but this has improved over time.  She does have occasional low back pain without radiation down the legs.  She reports no weakness of the arms or legs.  She does occasionally have tingling in the fingers that comes and goes but this is not frequent.  She reports no balance changes or falls.  She denies  issues controlling the bowels or bladder with exception of occasional stress incontinence of the bladder.  The patient has not been bothered with her sleeping because of the discomfort.  She will note some of the tingling whether or not she is on her feet or whether she is resting.  She reports occasional sharp pains in the right occipital area of the head.   REVIEW OF SYSTEMS: Out of a complete 14 system review of symptoms, the patient complains only of the following symptoms, and all other reviewed systems are negative.  Numbness  ALLERGIES: Allergies  Allergen Reactions  . Diclofenac     Flushing     HOME MEDICATIONS: Outpatient Medications Prior to Visit  Medication Sig Dispense Refill  . Cholecalciferol (VITAMIN D3 PO) Take by mouth. Takes between 2000 and 5000 units.    . Ferrous Sulfate (IRON) 325 (65 Fe) MG TABS Take by mouth. 1 tab per week    . triamterene-hydrochlorothiazide (MAXZIDE-25) 37.5-25 MG tablet Take 1 tablet by mouth every morning.  3   No facility-administered medications prior to visit.    PAST MEDICAL HISTORY: Past Medical History:  Diagnosis Date  . Diabetes mellitus without complication (HCC)    prediabetes  . Diabetic peripheral neuropathy ([place]) 02/19/2019  . Elevated hemoglobin A1c   . Hypertension   . Neck pain   . Plantar fasciitis     PAST SURGICAL HISTORY: Past Surgical History:  Procedure Laterality Date  . CESAREAN SECTION    .  FOOT SURGERY     Bilateral  . TONSILLECTOMY    . TUBAL LIGATION      FAMILY HISTORY: Family History  Problem Relation Age of Onset  . Cancer Mother        GYN  . Drug abuse Father   . Cancer Father   . Cancer Maternal Aunt        breast, lung  . Cancer Maternal Grandmother        brain  . Cancer Maternal Grandfather        throat    SOCIAL HISTORY: Social History   Socioeconomic History  . Marital status: Married    Spouse name: Not on file  . Number of children: Not on file  . Years of  education: Not on file  . Highest education level: Not on file  Occupational History  . Not on file  Tobacco Use  . Smoking status: Never Smoker  . Smokeless tobacco: Never Used  Substance and Sexual Activity  . Alcohol use: Yes    Comment: Rarely   . Drug use: Not Currently  . Sexual activity: Yes    Birth control/protection: Surgical  Other Topics Concern  . Not on file  Social History Narrative   Right handed    Caffeine 2 -3 cups daily    Lives at home with husband and 1 adult.    3 children   Social Determinants of Health   Financial Resource Strain:   . Difficulty of Paying Living Expenses: Not on file  Food Insecurity:   . Worried About Charity fundraiser in the Last Year: Not on file  . Ran Out of Food in the Last Year: Not on file  Transportation Needs:   . Lack of Transportation (Medical): Not on file  . Lack of Transportation (Non-Medical): Not on file  Physical Activity:   . Days of Exercise per Week: Not on file  . Minutes of Exercise per Session: Not on file  Stress:   . Feeling of Stress : Not on file  Social Connections:   . Frequency of Communication with Friends and Family: Not on file  . Frequency of Social Gatherings with Friends and Family: Not on file  . Attends Religious Services: Not on file  . Active Member of Clubs or Organizations: Not on file  . Attends Archivist Meetings: Not on file  . Marital Status: Not on file  Intimate Partner Violence:   . Fear of Current or Ex-Partner: Not on file  . Emotionally Abused: Not on file  . Physically Abused: Not on file  . Sexually Abused: Not on file   PHYSICAL EXAM  Vitals:   09/10/19 1103  BP: 129/86  Pulse: 64  Temp: (!) 97.1 F (36.2 C)  TempSrc: Oral  Weight: 275 lb (124.7 kg)  Height: 5' 4.75" (1.645 m)   Body mass index is 46.12 kg/m.  Generalized: Well developed, in no acute distress   Neurological examination  Mentation: Alert oriented to time, place, history  taking. Follows all commands speech and language fluent Cranial nerve II-XII: Pupils were equal round reactive to light. Extraocular movements were full, visual field were full on confrontational test. Facial sensation and strength were normal.  Head turning and shoulder shrug  were normal and symmetric. Motor: The motor testing reveals 5 over 5 strength of all 4 extremities. Good symmetric motor tone is noted throughout.  Sensory: Decreased pinprick sensation and vibration up to mid shin Coordination: Cerebellar testing  reveals good finger-nose-finger and heel-to-shin bilaterally.  Gait and station: Gait is normal. Tandem gait is normal.   Reflexes: Deep tendon reflexes are symmetric and normal bilaterally.   DIAGNOSTIC DATA (LABS, IMAGING, TESTING) - I reviewed patient records, labs, notes, testing and imaging myself where available.  Lab Results  Component Value Date   WBC 6.1 07/03/2019   HGB 12.1 07/03/2019   HCT 36.9 07/03/2019   MCV 82 07/03/2019   PLT 250 07/03/2019      Component Value Date/Time   NA 141 07/03/2019 1019   K 4.0 07/03/2019 1019   CL 105 07/03/2019 1019   CO2 23 07/03/2019 1019   GLUCOSE 118 (H) 07/03/2019 1019   BUN 22 07/03/2019 1019   CREATININE 0.92 07/03/2019 1019   CALCIUM 9.4 07/03/2019 1019   PROT 6.4 07/03/2019 1019   ALBUMIN 4.0 07/03/2019 1019   AST 85 (H) 07/03/2019 1019   ALT 97 (H) 07/03/2019 1019   ALKPHOS 94 07/03/2019 1019   BILITOT 0.4 07/03/2019 1019   GFRNONAA 74 07/03/2019 1019   GFRAA 85 07/03/2019 1019   Lab Results  Component Value Date   CHOL 202 (H) 07/03/2019   HDL 54 07/03/2019   LDLCALC 125 (H) 07/03/2019   TRIG 129 07/03/2019   CHOLHDL 3.7 07/03/2019   Lab Results  Component Value Date   HGBA1C 6.3 (H) 07/03/2019   Lab Results  Component Value Date   VITAMINB12 689 02/19/2019   Lab Results  Component Value Date   TSH 2.250 07/03/2019    ASSESSMENT AND PLAN 49 y.o. year old female  has a past medical  history of Diabetes mellitus without complication ([place]), Diabetic peripheral neuropathy ([place]) (02/19/2019), Elevated hemoglobin A1c, Hypertension, Neck pain, and Plantar fasciitis. here with:  1.  Peripheral neuropathy  EMG nerve conduction showed mild sensory changes that could be related to a peripheral neuropathy.  Her recent A1c was 6.3.  Her neuropathy symptoms remain stable, fortunately, are not associated with pain.  She is not requiring any medications.  I encouraged her to incorporate exercise, diet, and weight loss for management of her condition, she is interested, and considering her options.  She will continue close follow-up with her primary provider.  She will follow-up in this office on an as-needed basis.   I spent 15 minutes with the patient. 50% of this time was spent discussing her plan of care.  Butler Denmark, AGNP-C, DNP 09/10/2019, 11:47 AM [facility] [address] [place], [place] [address] [phone]

## 2019-09-10 ENCOUNTER — Encounter: Payer: Self-pay | Admitting: Neurology

## 2019-09-10 ENCOUNTER — Other Ambulatory Visit: Payer: Self-pay

## 2019-09-10 ENCOUNTER — Ambulatory Visit (INDEPENDENT_AMBULATORY_CARE_PROVIDER_SITE_OTHER): Payer: 59 | Admitting: Neurology

## 2019-09-10 VITALS — BP 129/86 | HR 64 | Temp 97.1°F | Ht 64.75 in | Wt 275.0 lb

## 2019-09-10 DIAGNOSIS — E1142 Type 2 diabetes mellitus with diabetic polyneuropathy: Secondary | ICD-10-CM

## 2019-09-10 NOTE — Patient Instructions (Addendum)
Really consider weight loss, diet, and exercise for control of your symptoms. If your pain or symptoms worsen please let me know, we could consider medication.

## 2019-09-10 NOTE — Progress Notes (Signed)
I have read the note, and I agree with the clinical assessment and plan.  Kashvi Prevette K Uchenna Seufert   

## 2019-10-09 ENCOUNTER — Ambulatory Visit: Payer: BC Managed Care – PPO | Admitting: Adult Health

## 2019-11-06 ENCOUNTER — Other Ambulatory Visit: Payer: Self-pay

## 2019-11-06 ENCOUNTER — Encounter: Payer: Self-pay | Admitting: Family Medicine

## 2019-11-06 ENCOUNTER — Ambulatory Visit (INDEPENDENT_AMBULATORY_CARE_PROVIDER_SITE_OTHER): Payer: 59 | Admitting: Family Medicine

## 2019-11-06 DIAGNOSIS — R7303 Prediabetes: Secondary | ICD-10-CM

## 2019-11-06 DIAGNOSIS — I10 Essential (primary) hypertension: Secondary | ICD-10-CM | POA: Diagnosis not present

## 2019-11-06 DIAGNOSIS — E7800 Pure hypercholesterolemia, unspecified: Secondary | ICD-10-CM

## 2019-11-06 DIAGNOSIS — K760 Fatty (change of) liver, not elsewhere classified: Secondary | ICD-10-CM

## 2019-11-06 DIAGNOSIS — R7989 Other specified abnormal findings of blood chemistry: Secondary | ICD-10-CM

## 2019-11-06 LAB — POCT GLYCOSYLATED HEMOGLOBIN (HGB A1C): Hemoglobin A1C: 6.2 % — AB (ref 4.0–5.6)

## 2019-11-06 NOTE — Patient Instructions (Addendum)
Look into use of the OPTAVIA plan, or Weight Watchers.     Please realize, EXERCISE IS MEDICINE!  -  American Heart Association [facility]) guidelines for exercise : If you are in good health, without any medical conditions, you should engage in 150-300 minutes of moderate intensity aerobic activity per week.  This means you should be huffing and puffing throughout your workout.   Engaging in regular exercise will improve brain function and memory, as well as improve mood, boost immune system and help with weight management.  As well as the other, more well-known effects of exercise such as decreasing blood sugar levels, decreasing blood pressure,  and decreasing bad cholesterol levels/ increasing good cholesterol levels.     -  The AHA strongly endorses consumption of a diet that contains a variety of foods from all the food categories with an emphasis on fruits and vegetables; fat-free and low-fat dairy products; cereal and grain products; legumes and nuts; and fish, poultry, and/or extra lean meats.    Excessive food intake, especially of foods high in saturated and trans fats, sugar, and salt, should be avoided.    Adequate water intake of roughly 1/2 of your weight in pounds, should equal the ounces of water per day you should drink.  So for instance, if you're 200 pounds, that would be 100 ounces of water per day.          Mediterranean Diet  Why follow it? Research shows. . Those who follow the Mediterranean diet have a reduced risk of heart disease  . The diet is associated with a reduced incidence of Parkinson's and Alzheimer's diseases . People following the diet may have longer life expectancies and lower rates of chronic diseases  . The Dietary Guidelines for Americans recommends the Mediterranean diet as an eating plan to promote health and prevent disease  What Is the Mediterranean Diet?  . Healthy eating plan based on typical foods and recipes of Mediterranean-style cooking . The  diet is primarily a plant based diet; these foods should make up a majority of meals   Starches - Plant based foods should make up a majority of meals - They are an important sources of vitamins, minerals, energy, antioxidants, and fiber - Choose whole grains, foods high in fiber and minimally processed items  - Typical grain sources include wheat, oats, barley, corn, brown rice, bulgar, farro, millet, polenta, couscous  - Various types of beans include chickpeas, lentils, fava beans, black beans, white beans   Fruits  Veggies - Large quantities of antioxidant rich fruits & veggies; 6 or more servings  - Vegetables can be eaten raw or lightly drizzled with oil and cooked  - Vegetables common to the traditional Mediterranean Diet include: artichokes, arugula, beets, broccoli, brussel sprouts, cabbage, carrots, celery, collard greens, cucumbers, eggplant, kale, leeks, lemons, lettuce, mushrooms, okra, onions, peas, peppers, potatoes, pumpkin, radishes, rutabaga, shallots, spinach, sweet potatoes, turnips, zucchini - Fruits common to the Mediterranean Diet include: apples, apricots, avocados, cherries, clementines, dates, figs, grapefruits, grapes, melons, nectarines, oranges, peaches, pears, pomegranates, strawberries, tangerines  Fats - Replace butter and margarine with healthy oils, such as olive oil, canola oil, and tahini  - Limit nuts to no more than a handful a day  - Nuts include walnuts, almonds, pecans, pistachios, pine nuts  - Limit or avoid candied, honey roasted or heavily salted nuts - Olives are central to the Mediterranean diet - can be eaten whole or used in a variety of dishes  Meats Protein - Limiting red meat: no more than a few times a month - When eating red meat: choose lean cuts and keep the portion to the size of deck of cards - Eggs: approx. 0 to 4 times a week  - Fish and lean poultry: at least 2 a week  - Healthy protein sources include, chicken, [place], lean beef,  lamb - Increase intake of seafood such as tuna, salmon, trout, mackerel, shrimp, scallops - Avoid or limit high fat processed meats such as sausage and bacon  Dairy - Include moderate amounts of low fat dairy products  - Focus on healthy dairy such as fat free yogurt, skim milk, low or reduced fat cheese - Limit dairy products higher in fat such as whole or 2% milk, cheese, ice cream  Alcohol - Moderate amounts of red wine is ok  - No more than 5 oz daily for women (all ages) and men older than age 10  - No more than 10 oz of wine daily for men younger than 11  Other - Limit sweets and other desserts  - Use herbs and spices instead of salt to flavor foods  - Herbs and spices common to the traditional Mediterranean Diet include: basil, bay leaves, chives, cloves, cumin, fennel, garlic, lavender, marjoram, mint, oregano, parsley, pepper, rosemary, sage, savory, sumac, tarragon, thyme   It's not just a diet, it's a lifestyle:  . The Mediterranean diet includes lifestyle factors typical of those in the region  . Foods, drinks and meals are best eaten with others and savored . Daily physical activity is important for overall good health . This could be strenuous exercise like running and aerobics . This could also be more leisurely activities such as walking, housework, yard-work, or taking the stairs . Moderation is the key; a balanced and healthy diet accommodates most foods and drinks . Consider portion sizes and frequency of consumption of certain foods   Meal Ideas & Options:  . Breakfast:  o Whole wheat toast or whole wheat English muffins with peanut butter & hard boiled egg o Steel cut oats topped with apples & cinnamon and skim milk  o Fresh fruit: banana, strawberries, melon, berries, peaches  o Smoothies: strawberries, bananas, greek yogurt, peanut butter o Low fat greek yogurt with blueberries and granola  o Egg white omelet with spinach and mushrooms o Breakfast couscous: whole  wheat couscous, apricots, skim milk, cranberries  . Sandwiches:  o Hummus and grilled vegetables (peppers, zucchini, squash) on whole wheat bread   o Grilled chicken on whole wheat pita with lettuce, tomatoes, cucumbers or tzatziki  o Tuna salad on whole wheat bread: tuna salad made with greek yogurt, olives, red peppers, capers, green onions o Garlic rosemary lamb pita: lamb sauted with garlic, rosemary, salt & pepper; add lettuce, cucumber, greek yogurt to pita - flavor with lemon juice and black pepper  . Seafood:  o Mediterranean grilled salmon, seasoned with garlic, basil, parsley, lemon juice and black pepper o Shrimp, lemon, and spinach whole-grain pasta salad made with low fat greek yogurt  o Seared scallops with lemon orzo  o Seared tuna steaks seasoned salt, pepper, coriander topped with tomato mixture of olives, tomatoes, olive oil, minced garlic, parsley, green onions and cappers  . Meats:  o Herbed greek chicken salad with kalamata olives, cucumber, feta  o Red bell peppers stuffed with spinach, bulgur, lean ground beef (or lentils) & topped with feta   o Kebabs: skewers of chicken, tomatoes, onions,  zucchini, squash  o [place] burgers: made with red onions, mint, dill, lemon juice, feta cheese topped with roasted red peppers . Vegetarian o Cucumber salad: cucumbers, artichoke hearts, celery, red onion, feta cheese, tossed in olive oil & lemon juice  o Hummus and whole grain pita points with a greek salad (lettuce, tomato, feta, olives, cucumbers, red onion) o Lentil soup with celery, carrots made with vegetable broth, garlic, salt and pepper  o Tabouli salad: parsley, bulgur, mint, scallions, cucumbers, tomato, radishes, lemon juice, olive oil, salt and pepper.   Risk factors for prediabetes and type 2 diabetes  Researchers don't fully understand why some people develop prediabetes and type 2 diabetes and others don't.  It's clear that certain factors increase the risk,  however, including:  Weight. The more fatty tissue you have, the more resistant your cells become to insulin.  Inactivity. The less active you are, the greater your risk. Physical activity helps you control your weight, uses up glucose as energy and makes your cells more sensitive to insulin.  Family history. Your risk increases if a parent or sibling has type 2 diabetes.  Race. Although it's unclear why, people of certain races -- including blacks, Hispanics, American Indians and Asian-Americans -- are at higher risk.  Age. Your risk increases as you get older. This may be because you tend to exercise less, lose muscle mass and gain weight as you age. But type 2 diabetes is also increasing dramatically among children, adolescents and younger adults.  Gestational diabetes. If you developed gestational diabetes when you were pregnant, your risk of developing prediabetes and type 2 diabetes later increases. If you gave birth to a baby weighing more than 9 pounds (4 kilograms), you're also at risk of type 2 diabetes.  Polycystic ovary syndrome. For women, having polycystic ovary syndrome -- a common condition characterized by irregular menstrual periods, excess hair growth and obesity -- increases the risk of diabetes.  High blood pressure. Having blood pressure over 140/90 millimeters of mercury (mm Hg) is linked to an increased risk of type 2 diabetes.  Abnormal cholesterol and triglyceride levels. If you have low levels of high-density lipoprotein (HDL), or "good," cholesterol, your risk of type 2 diabetes is higher. Triglycerides are another type of fat carried in the blood. People with high levels of triglycerides have an increased risk of type 2 diabetes. Your doctor can let you know what your cholesterol and triglyceride levels are.  A good guide to good carbs: The glycemic index ---If you have diabetes, or at risk for diabetes, you know all too well that when you eat carbohydrates, your blood sugar  goes up. The total amount of carbs you consume at a meal or in a snack mostly determines what your blood sugar will do. But the food itself also plays a role. A serving of white rice has almost the same effect as eating pure table sugar -- a quick, high spike in blood sugar. A serving of lentils has a slower, smaller effect.  ---Picking good sources of carbs can help you control your blood sugar and your weight. Even if you don't have diabetes, eating healthier carbohydrate-rich foods can help ward off a host of chronic conditions, from heart disease to various cancers to, well, diabetes.  ---One way to choose foods is with the glycemic index (GI). This tool measures how much a food boosts blood sugar.  The glycemic index rates the effect of a specific amount of a food on blood sugar compared  with the same amount of pure glucose. A food with a glycemic index of 28 boosts blood sugar only 28% as much as pure glucose. One with a GI of 95 acts like pure glucose.    High glycemic foods result in a quick spike in insulin and blood sugar (also known as blood glucose).  Low glycemic foods have a slower, smaller effect- these are healthier for you.   Using the glycemic index Using the glycemic index is easy: choose foods in the low GI category instead of those in the high GI category (see below), and go easy on those in between. Low glycemic index (GI of 55 or less): Most fruits and vegetables, beans, minimally processed grains, pasta, low-fat dairy foods, and nuts.  Moderate glycemic index (GI 56 to 69): White and sweet potatoes, corn, white rice, couscous, breakfast cereals such as Cream of Wheat and Mini Wheats.  High glycemic index (GI of 70 or higher): White bread, rice cakes, most crackers, bagels, cakes, doughnuts, croissants, most packaged breakfast cereals. You can see the values for 100 commons foods and get links to more at www.[email or web address].si.  Swaps for lowering glycemic index   Instead of this high-glycemic index food Eat this lower-glycemic index food  White rice Brown rice or converted rice  Instant oatmeal Steel-cut oats  Cornflakes Bran flakes  Baked potato Pasta, bulgur  White bread Whole-grain bread  Corn Peas or leafy greens       Prediabetes Eating Plan  Prediabetes--also called impaired glucose tolerance or impaired fasting glucose--is a condition that causes blood sugar (blood glucose) levels to be higher than normal. Following a healthy diet can help to keep prediabetes under control. It can also help to lower the risk of type 2 diabetes and heart disease, which are increased in people who have prediabetes. Along with regular exercise, a healthy diet:  Promotes weight loss.  Helps to control blood sugar levels.  Helps to improve the way that the body uses insulin.   WHAT DO I NEED TO KNOW ABOUT THIS EATING PLAN?   Use the glycemic index (GI) to plan your meals. The index tells you how quickly a food will raise your blood sugar. Choose low-GI foods. These foods take a longer time to raise blood sugar.  Pay close attention to the amount of carbohydrates in the food that you eat. Carbohydrates increase blood sugar levels.  Keep track of how many calories you take in. Eating the right amount of calories will help you to achieve a healthy weight. Losing about 7 percent of your starting weight can help to prevent type 2 diabetes.  You may want to follow a Mediterranean diet. This diet includes a lot of vegetables, lean meats or fish, whole grains, fruits, and healthy oils and fats.   WHAT FOODS CAN I EAT?  Grains Whole grains, such as whole-wheat or whole-grain breads, crackers, cereals, and pasta. Unsweetened oatmeal. Bulgur. Barley. Quinoa. Brown rice. Corn or whole-wheat flour tortillas or taco shells. Vegetables Lettuce. Spinach. Peas. Beets. Cauliflower. Cabbage. Broccoli. Carrots. Tomatoes. Squash. Eggplant. Herbs. Peppers. Onions.  Cucumbers. Brussels sprouts. Fruits Berries. Bananas. Apples. Oranges. Grapes. Papaya. Mango. Pomegranate. Kiwi. Grapefruit. Cherries. Meats and Other Protein Sources Seafood. Lean meats, such as chicken and [place] or lean cuts of pork and beef. Tofu. Eggs. Nuts. Beans. Dairy Low-fat or fat-free dairy products, such as yogurt, cottage cheese, and cheese. Beverages Water. Tea. Coffee. Sugar-free or diet soda. Seltzer water. Milk. Milk alternatives, such as soy or almond milk.  Condiments Mustard. Relish. Low-fat, low-sugar ketchup. Low-fat, low-sugar barbecue sauce. Low-fat or fat-free mayonnaise. Sweets and Desserts Sugar-free or low-fat pudding. Sugar-free or low-fat ice cream and other frozen treats. Fats and Oils Avocado. Walnuts. Olive oil. The items listed above may not be a complete list of recommended foods or beverages. Contact your dietitian for more options.    WHAT FOODS ARE NOT RECOMMENDED?  Grains Refined white flour and flour products, such as bread, pasta, snack foods, and cereals. Beverages Sweetened drinks, such as sweet iced tea and soda. Sweets and Desserts Baked goods, such as cake, cupcakes, pastries, cookies, and cheesecake. The items listed above may not be a complete list of foods and beverages to avoid. Contact your dietitian for more information.   This information is not intended to replace advice given to you by your health care provider. Make sure you discuss any questions you have with your health care provider.   Document Released: 12/29/2014 Document Reviewed: 12/29/2014 Elsevier Interactive Patient Education 2016 [employer]      Diabetic Neuropathy Diabetic neuropathy refers to nerve damage that is caused by diabetes (diabetes mellitus). Over time, people with diabetes can develop nerve damage throughout the body. There are several types of diabetic neuropathy:  Peripheral neuropathy. This is the most common type of diabetic neuropathy. It  causes damage to nerves that carry signals between the spinal cord and other parts of the body (peripheral nerves). This usually affects nerves in the feet and legs first, and may eventually affect the hands and arms. The damage affects the ability to sense touch or temperature.  Autonomic neuropathy. This type causes damage to nerves that control involuntary functions (autonomic nerves). These nerves carry signals that control: ? Heartbeat. ? Body temperature. ? Blood pressure. ? Urination. ? Digestion. ? Sweating. ? Sexual function. ? Response to changing blood sugar (glucose) levels.  Focal neuropathy. This type of nerve damage affects one area of the body, such as an arm, a leg, or the face. The injury may involve one nerve or a small group of nerves. Focal neuropathy can be painful and unpredictable, and occurs most often in older adults with diabetes. This often develops suddenly, but usually improves over time and does not cause long-term problems.  Proximal neuropathy. This type of nerve damage affects the nerves of the thighs, hips, buttocks, or legs. It causes severe pain, weakness, and muscle death (atrophy), usually in the thigh muscles. It is more common among older men and people who have type 2 diabetes. The length of recovery time may vary. What are the causes? Peripheral, autonomic, and focal neuropathies are caused by diabetes that is not well controlled with treatment. The cause of proximal neuropathy is not known, but it may be caused by inflammation related to uncontrolled blood glucose levels. What are the signs or symptoms? Peripheral neuropathy Peripheral neuropathy develops slowly over time. When the nerves of the feet and legs no longer work, you may experience:  Burning, stabbing, or aching pain in the legs or feet.  Pain or cramping in the legs or feet.  Loss of feeling (numbness) and inability to feel pressure or pain in the feet. This can lead to: ? Thick  calluses or sores on areas of constant pressure. ? Ulcers. ? Reduced ability to feel temperature changes.  Foot deformities.  Muscle weakness.  Loss of balance or coordination. Autonomic neuropathy The symptoms of autonomic neuropathy vary depending on which nerves are affected. Symptoms may include:  Problems with digestion, such  as: ? Nausea or vomiting. ? Poor appetite. ? Bloating. ? Diarrhea or constipation. ? Trouble swallowing. ? Losing weight without trying to.  Problems with the heart, blood and lungs, such as: ? Dizziness, especially when standing up. ? Fainting. ? Shortness of breath. ? Irregular heartbeat.  Bladder problems, such as: ? Trouble starting or stopping urination. ? Leaking urine. ? Trouble emptying the bladder. ? Urinary tract infections (UTIs).  Problems with other body functions, such as: ? Sweat. You may sweat too much or too little. ? Temperature. You might get hot easily. Or, you might feel cold more than usual. ? Sexual function. Men may not be able to get or maintain an erection. Women may have vaginal dryness and difficulty with arousal. Focal neuropathy Symptoms affect only one area of the body. Common symptoms include:  Numbness.  Tingling.  Burning pain.  Prickling feeling.  Very sensitive skin.  Weakness.  Inability to move (paralysis).  Muscle twitching.  Muscles getting smaller (wasting).  Poor coordination.  Double or blurred vision. Proximal neuropathy  Sudden, severe pain in the hip, thigh, or buttocks. Pain may spread from the back into the legs (sciatica).  Pain and numbness in the arms and legs.  Tingling.  Loss of bladder control or bowel control.  Weakness and wasting of thigh muscles.  Difficulty getting up from a seated position.  Abdominal swelling.  Unexplained weight loss. How is this diagnosed? Diagnosis usually involves reviewing your medical history and any symptoms you have. Diagnosis  varies depending on the type of neuropathy your health care provider suspects. Peripheral neuropathy Your health care provider will check areas that are affected by your nervous system (neurologic exam), such as your reflexes, how you move, and what you can feel. You may have other tests, such as:  Blood tests.  Removal and examination of fluid that surrounds the spinal cord (lumbar puncture).  CT scan.  MRI.  A test to check the nerves that control muscles (electromyogram, EMG).  Tests of how quickly messages pass through your nerves (nerve conduction velocity tests).  Removal of a small piece of nerve to be examined under a microscope (biopsy). Autonomic neuropathy You may have tests, such as:  Tests to measure your blood pressure and heart rate. This may include monitoring you while you are safely secured to an exam table that moves you from a lying position to an upright position (table tilt test).  Breathing tests to check your lungs.  Tests to check how food moves through the digestive system (gastric emptying tests).  Blood, sweat, or urine tests.  Ultrasound of your bladder.  Spinal fluid tests. Focal neuropathy This condition may be diagnosed with:  A neurologic exam.  CT scan.  MRI.  EMG.  Nerve conduction velocity tests. Proximal neuropathy There is no test to diagnose this type of neuropathy. You may have tests to rule out other possible causes of this type of neuropathy. Tests may include:  X-rays of your spine and lumbar region.  Lumbar puncture.  MRI. How is this treated? The goal of treatment is to keep nerve damage from getting worse. The most important part of treatment is keeping your blood glucose level and your A1C level within your target range by following your diabetes management plan. Over time, maintaining lower blood glucose levels helps lessen symptoms. In some cases, you may need prescription pain medicine. Follow these instructions at  home:  Lifestyle   Do not use any products that contain nicotine or tobacco,  such as cigarettes and e-cigarettes. If you need help quitting, ask your health care provider.  Be physically active every day. Include strength training and balance exercises.  Follow a healthy meal plan.  Work with your health care provider to manage your blood pressure. General instructions  Follow your diabetes management plan as directed. ? Check your blood glucose levels as directed by your health care provider. ? Keep your blood glucose in your target range as directed by your health care provider. ? Have your A1C level checked at least two times a year, or as often as told by your health care provider.  Take over the counter and prescription medicines only as told by your health care provider. This includes insulin and diabetes medicine.  Do not drive or use heavy machinery while taking prescription pain medicines.  Check your skin and feet every day for cuts, bruises, redness, blisters, or sores.  Keep all follow up visits as told by your health care provider. This is important. Contact a health care provider if:  You have burning, stabbing, or aching pain in your legs or feet.  You are unable to feel pressure or pain in your feet.  You develop problems with digestion, such as: ? Nausea. ? Vomiting. ? Bloating. ? Constipation. ? Diarrhea. ? Abdominal pain.  You have difficulty with urination, such as inability: ? To control when you urinate (incontinence). ? To completely empty the bladder (retention).  You have palpitations.  You feel dizzy, weak, or faint when you stand up. Get help right away if:  You cannot urinate.  You have sudden weakness or loss of coordination.  You have trouble speaking.  You have pain or pressure in your chest.  You have an irregular heart beat.  You have sudden inability to move a part of your body. Summary  Diabetic neuropathy refers to  nerve damage that is caused by diabetes. It can affect nerves throughout the entire body, causing numbness and pain in the arms, legs, digestive tract, heart, and other body systems.  Keep your blood glucose level and your blood pressure in your target range, as directed by your health care provider. This can help prevent neuropathy from getting worse.  Check your skin and feet every day for cuts, bruises, redness, blisters, or sores.  Do not use any products that contain nicotine or tobacco, such as cigarettes and e-cigarettes. If you need help quitting, ask your health care provider. This information is not intended to replace advice given to you by your health care provider. Make sure you discuss any questions you have with your health care provider. Document Revised: 09/26/2017 Document Reviewed: 09/18/2016 Elsevier Patient Education  2020 [employer].

## 2019-11-06 NOTE — Progress Notes (Signed)
Of note, this is my first time meeting patient.  Patient is new to me and was previously being cared for at our office by Mina Marble, NP, who no longer works at primary care [employer].    Impression and Recommendations:    1. Morbidly obese ([place])   2. Hypertension, unspecified type   3. Pre-diabetes   4. Elevated LDL cholesterol level   5. Fatty infiltration of liver- seen on abdominal ultrasound performed 05/2014   6. Elevated LFTs     Hypertension - Per patient, not checking her blood pressure at home. - BP on intake elevated today at 148/81.  Recheck was 112/75  - Patient will continue current treatment regimen.  See med list.  - Counseled patient on pathophysiology of disease and discussed various treatment options, which always includes dietary and lifestyle modification as first line.   - Lifestyle changes such as dash and heart healthy diets and engaging in a regular exercise program discussed extensively with patient.   - Ambulatory blood pressure monitoring encouraged at least 3 times weekly.  Keep log and bring in every office visit.  Reminded patient that if they ever feel poorly in any way, to check their blood pressure and pulse.  - We will continue to monitor.  Pre-diabetes - 6.3, elevated last check 4 months ago. - A1c was measured at 6.0 in June of 2020. - A1c re-checked today. -We told patient we will call her and let her know results if they are elevated / higher than prior at 6.3  - Educated patient today regarding meaning of the A1c measurement.  - Extensive education provided to patient today regarding the onset and nature of diabetes.  - Counseled patient on prevention of diabetes and discussed dietary and lifestyle modification as first line.  In addition to this, reviewed possibility of beginning metformin for additional control.  - Recommended beginning metformin today.  Patient declined this medicine despite extensive discussion how it can  aid in weight loss, slow progression to diabetes etc.  - Importance of low carb, heart-healthy diet discussed with patient in addition to regular aerobic exercise of 23min 5d/week or more.   - Handouts provided at patient's desire and or told to go online at the American Diabetes Association website for further information  - Advised patient to continue with prudent lifestyle modifications. - We will continue to monitor and re-check A1c in June of 2021.   Diabetic Peripheral Neuropathy -I explained to patient that she could have had onset of diabetes many years ago (have had a A1c at 6.5 or above) but never knew it  -Explained to patient you can have prediabetes and also get diabetic peripheral neuropathy.  - Reviewed patient's symptoms during appointment today.  - Patient has been seen by neurology in the past.  And I reviewed those notes as well today.  She was last seen by neuro on 09/10/2019.  - Advised patient of critical need to control her blood sugar to help improve symptoms.  - Extensive education provided and patient understands this is a progressive disease that will worsen with age and less intensive lifestyle changes are made.  all questions answered.  - Will continue to monitor.    Elevated LDL cholesterol level  - LDL = 125, elevated last check 4 months ago. -The 10-year ASCVD risk score Mikey Bussing DC Jr., et al., 2013) is: 2.2% - Prudent dietary changes such as low saturated & trans fat diets for hyperlipidemia and low carb  diets for hypertriglyceridemia discussed with patient.    - Encouraged patient to follow AHA guidelines for regular exercise and also engage in weight loss if BMI above 25.   - We will continue to monitor.  - Re-check as discussed in June of 2021.    Elevated LFTs -Due to fat T liver AST = 85 last check, up from 65 nine months prior. ALT = 97 last check, up from 72 nine months prior. -Explained this is likely due to her increased BMI.   -  Recommended weight loss and avoidance of hepatotoxic substances - Will continue to monitor and re-check as discussed.   Fatty liver infiltrate -As seen on abdominal CT done in 2015, the integrity of the liver parenchyma has been altered and consistent with fatty infiltration.    BMI Counseling -morbid obesity adult  -Asked for permission to discuss her weight with her, which was granted  -Patient admitted she hates the term "obesity ".  She would rather [place] call her fat.   - I explained from a physician's standpoint, obesity is a medical term not imparting judgment and I was not comfortable using the F word with her.  I explained why we use this term  - Explained to patient what BMI refers to, and what it means medically.    Told patient to think about it as a "medical risk stratification measurement" and how increasing BMI is associated with increasing risk/ or worsening state of various diseases such as hypertension, hyperlipidemia, diabetes, premature OA, depression etc.  American Heart Association guidelines for healthy diet, basically Mediterranean diet, and exercise guidelines of 30 minutes 5 days per week or more discussed in detail.  - Encouraged patient to work on losing just 5-10% of her weight will result in significant positive health changes such as lowering her blood pressure, improving cholesterol levels, A1c's etc.  Health counseling performed.  All questions answered.    Lifestyle & Preventative Health Maintenance - Advised patient to continue working toward exercising to improve overall mental, physical, and emotional health.    - Reviewed the "spokes of the wheel" of mood and health management.  Stressed the importance of ongoing prudent habits, including regular exercise, appropriate sleep hygiene, healthful dietary habits, and prayer/meditation to relax.  - Encouraged patient to engage in daily physical activity as tolerated, especially a formal exercise routine.   Recommended that the patient eventually strive for at least 150 minutes of moderate cardiovascular activity per week according to guidelines established by the [facility].   - Healthy dietary habits encouraged, including low-carb, and high amounts of lean protein in diet.   - Patient should also consume adequate amounts of water.   Orders Placed This Encounter  Procedures  . POCT glycosylated hemoglobin (Hb A1C)    Gross side effects, risk and benefits, and alternatives of medications and treatment plan in general discussed with patient.  Patient is aware that all medications have potential side effects and we are unable to predict every side effect or drug-drug interaction that may occur.   Patient will call with any questions prior to using medication if they have concerns.    Expresses verbal understanding and consents to current therapy and treatment regimen.  No barriers to understanding were identified.  Red flag symptoms and signs discussed in detail.  Patient expressed understanding regarding what to do in case of emergency\urgent symptoms  Please see AVS handed out to patient at the end of our visit for further patient instructions/ counseling done pertaining  to today's office visit.   Return for f/up in June, re-check A1c, FLP, with OV 3-5 days prior.     Note:  This note was prepared with assistance of Dragon voice recognition software. Occasional wrong-word or sound-a-like substitutions may have occurred due to the inherent limitations of voice recognition software.   The [place] was signed into law in 2016 which includes the topic of electronic health records.  This provides immediate access to information in MyChart.  This includes consultation notes, operative notes, office notes, lab results and pathology reports.  If you have any questions about what you read please let us know at your next visit or call us at the office.  We are right here with you.   This case  required medical decision making of at least moderate complexity.  This document serves as a record of services personally performed by Mellody Dance, DO. It was created on her behalf by Toni Amend, a trained medical scribe. The creation of this record is based on the scribe's personal observations and the provider's statements to them.   This case required medical decision making of at least moderate complexity. The above documentation from Toni Amend, medical scribe, has been reviewed by Marjory Sneddon, D.O.   .     --------------------------------------------------------------------------------------------------------------------------------------------------------------------------------------------------------------------------------------------    Subjective:     Phillips Odor, am serving as scribe for Dr.Breigh Annett.  HPI: Chelsea Robbins is a 49 y.o. female who presents to [place] [facility] today for issues as discussed below.  - Prediabetes & Diabetic Peripheral Neuropathy Diagnosis Notes she has nerve problems in her feet.  She has followed up with [facility] in the past.  She doesn't think that her neuropathy is due to diabetes, but notes she hasn't had her A1c regularly checked.  Says "they've always checked my sugar and it's been fine."  Notes she is averse to beginning medications and prefers to work on lifestyle changes.  - Lifestyle Habits For aid with prudent lifestyle changes, currently goes to weekly online meetings through her insurance, including readings, videos.  She is trying to monitor her calories and nutritional intake.  Says "it started off at how many steps you should take per day."  She is currently working on walking more and engaging in increased physical activity.  She has lost 5 lbs since January of 2021.  - History of Weight Loss Notes she's lost weight before, but "it's been a while."  Says "I  know what to do and how to do it, it's just [doing it]."  Confirms that it's hard to get the exercise routine in.  "I know from personal experience, when you exercise enough, it becomes a habit and you crave it."  Says she had foot surgery and had to stay off of it for two weeks, and that was enough to stop her habit.  - Eating Habits Notes she typically doesn't eat breakfast, and instead usually eats a large meal at night.  HPI:  Hypertension:  She does not check her blood pressure at home.  - Patient reports good compliance with medication and/or lifestyle modification  - Her denies acute concerns or problems related to treatment plan  - She denies new onset of: chest pain, exercise intolerance, shortness of breath, dizziness, visual changes, headache, lower extremity swelling or claudication.   Last 3 blood pressure readings in our office are as follows: BP Readings from Last 3 Encounters:  11/06/19 112/75  09/10/19  129/86  07/09/19 127/79   Filed Weights   11/06/19 0932  Weight: 270 lb (122.5 kg)       Wt Readings from Last 3 Encounters:  11/06/19 270 lb (122.5 kg)  09/10/19 275 lb (124.7 kg)  07/09/19 275 lb 9.6 oz (125 kg)   BP Readings from Last 3 Encounters:  11/06/19 112/75  09/10/19 129/86  07/09/19 127/79   Pulse Readings from Last 3 Encounters:  11/06/19 71  09/10/19 64  07/09/19 84   BMI Readings from Last 3 Encounters:  11/06/19 44.93 kg/m  09/10/19 46.12 kg/m  07/09/19 46.22 kg/m     Patient Care Team    Relationship Specialty Notifications Start End  Mina Marble D, NP PCP - General Family Medicine  03/24/19   Pedro Earls, MD Attending Physician Family Medicine  01/13/19   Ob/Gyn, [facility]    03/24/19      Patient Active Problem List   Diagnosis Date Noted  . Fatty infiltration of liver- seen on abdominal ultrasound performed 05/2014 11/06/2019  . Elevated LFTs 11/06/2019  . Hypertension 11/06/2019  . Elevated LDL cholesterol  level 07/09/2019  . Pre-diabetes 07/09/2019  . Healthcare maintenance 03/24/2019  . Diabetic peripheral neuropathy ([place]) 02/19/2019  . BMI 45.0-49.9, adult ([place]) 01/26/2018  . Irregular periods 01/26/2018  . Morbid obesity ([place]) 01/26/2018  . Pruritus ani 01/26/2018    Past Medical history, Surgical history, Family history, Social history, Allergies and Medications have been entered into the medical record, reviewed and changed as needed.    Current Meds  Medication Sig  . Cholecalciferol (VITAMIN D3 PO) Take by mouth. Takes between 2000 and 5000 units.  . Ferrous Sulfate (IRON) 325 (65 Fe) MG TABS Take by mouth. 1 tab per week  . triamterene-hydrochlorothiazide (MAXZIDE-25) 37.5-25 MG tablet Take 1 tablet by mouth every morning.    Allergies:  Allergies  Allergen Reactions  . Diclofenac     Flushing      Review of Systems:  A fourteen system review of systems was performed and found to be positive as per HPI.   Objective:   Blood pressure 112/75, pulse 71, temperature 98 F (36.7 C), temperature source Oral, resp. rate 12, height 5\' 5"  (1.651 m), weight 270 lb (122.5 kg), last menstrual period 10/19/2019, SpO2 97 %. Body mass index is 44.93 kg/m. General:  Well Developed, well nourished, appropriate for stated age.  Neuro:  Alert and oriented,  extra-ocular muscles intact  HEENT:  Normocephalic, atraumatic, neck supple, no carotid bruits appreciated  Skin:  no gross rash, warm, pink. Cardiac:  RRR, S1 S2 Respiratory:  ECTA B/L and A/P, Not using accessory muscles, speaking in full sentences- unlabored. Vascular:  Ext warm, no cyanosis apprec.; cap RF less 2 sec. Psych:  No HI/SI, judgement and insight good, Euthymic mood. Full Affect.

## 2020-02-04 ENCOUNTER — Other Ambulatory Visit: Payer: Self-pay | Admitting: Physician Assistant

## 2020-02-04 DIAGNOSIS — E7800 Pure hypercholesterolemia, unspecified: Secondary | ICD-10-CM

## 2020-02-04 DIAGNOSIS — I10 Essential (primary) hypertension: Secondary | ICD-10-CM

## 2020-02-04 DIAGNOSIS — R7989 Other specified abnormal findings of blood chemistry: Secondary | ICD-10-CM

## 2020-02-04 DIAGNOSIS — Z0000 Encounter for general adult medical examination without abnormal findings: Secondary | ICD-10-CM

## 2020-02-04 DIAGNOSIS — R7303 Prediabetes: Secondary | ICD-10-CM

## 2020-02-06 ENCOUNTER — Other Ambulatory Visit: Payer: 59

## 2020-02-06 ENCOUNTER — Telehealth: Payer: Self-pay | Admitting: Physician Assistant

## 2020-02-06 ENCOUNTER — Other Ambulatory Visit: Payer: Self-pay

## 2020-02-06 DIAGNOSIS — I10 Essential (primary) hypertension: Secondary | ICD-10-CM

## 2020-02-06 DIAGNOSIS — E7800 Pure hypercholesterolemia, unspecified: Secondary | ICD-10-CM

## 2020-02-06 DIAGNOSIS — R7303 Prediabetes: Secondary | ICD-10-CM

## 2020-02-06 DIAGNOSIS — Z0000 Encounter for general adult medical examination without abnormal findings: Secondary | ICD-10-CM

## 2020-02-06 DIAGNOSIS — R7989 Other specified abnormal findings of blood chemistry: Secondary | ICD-10-CM

## 2020-02-06 NOTE — Telephone Encounter (Signed)
Due to the schedule reduction, I needed to reschedule this patient's 3 mnth f/u. She does not know her schedule going to August (which is when her schedule begins to open). I have just cancelled her appt for Monday without a r/s and advised patient we will call her with lab results and IF an appt is needed based on lab results please inform front office staff to schedule.

## 2020-02-07 LAB — LIPID PANEL
Chol/HDL Ratio: 3.7 ratio (ref 0.0–4.4)
Cholesterol, Total: 207 mg/dL — ABNORMAL HIGH (ref 100–199)
HDL: 56 mg/dL (ref >39)
LDL Chol Calc (NIH): 132 mg/dL — ABNORMAL HIGH (ref 0–99)
Triglycerides: 108 mg/dL (ref 0–149)
VLDL Cholesterol Cal: 19 mg/dL (ref 5–40)

## 2020-02-07 LAB — HEMOGLOBIN A1C
Est. average glucose Bld gHb Est-mCnc: 120 mg/dL
Hgb A1c MFr Bld: 5.8 % — ABNORMAL HIGH (ref 4.8–5.6)

## 2020-02-09 ENCOUNTER — Ambulatory Visit: Payer: 59 | Admitting: Physician Assistant

## 2020-06-11 ENCOUNTER — Other Ambulatory Visit: Payer: 59

## 2020-06-11 ENCOUNTER — Other Ambulatory Visit: Payer: Self-pay

## 2020-06-11 ENCOUNTER — Other Ambulatory Visit: Payer: Self-pay | Admitting: Physician Assistant

## 2020-06-11 DIAGNOSIS — R7303 Prediabetes: Secondary | ICD-10-CM

## 2020-06-11 DIAGNOSIS — I10 Essential (primary) hypertension: Secondary | ICD-10-CM

## 2020-06-11 DIAGNOSIS — E7800 Pure hypercholesterolemia, unspecified: Secondary | ICD-10-CM

## 2020-06-12 LAB — LIPID PANEL
Chol/HDL Ratio: 3.6 ratio (ref 0.0–4.4)
Cholesterol, Total: 198 mg/dL (ref 100–199)
HDL: 55 mg/dL (ref >39)
LDL Chol Calc (NIH): 122 mg/dL — ABNORMAL HIGH (ref 0–99)
Triglycerides: 116 mg/dL (ref 0–149)
VLDL Cholesterol Cal: 21 mg/dL (ref 5–40)

## 2020-06-12 LAB — HEMOGLOBIN A1C
Est. average glucose Bld gHb Est-mCnc: 117 mg/dL
Hgb A1c MFr Bld: 5.7 % — ABNORMAL HIGH (ref 4.8–5.6)

## 2020-08-10 ENCOUNTER — Other Ambulatory Visit: Payer: Self-pay

## 2020-08-10 ENCOUNTER — Encounter: Payer: Self-pay | Admitting: Physician Assistant

## 2020-08-10 ENCOUNTER — Ambulatory Visit (INDEPENDENT_AMBULATORY_CARE_PROVIDER_SITE_OTHER): Payer: 59 | Admitting: Physician Assistant

## 2020-08-10 VITALS — BP 126/74 | HR 71 | Ht 65.0 in | Wt 252.4 lb

## 2020-08-10 DIAGNOSIS — E7800 Pure hypercholesterolemia, unspecified: Secondary | ICD-10-CM

## 2020-08-10 DIAGNOSIS — Z6841 Body Mass Index (BMI) 40.0 and over, adult: Secondary | ICD-10-CM

## 2020-08-10 DIAGNOSIS — Z1211 Encounter for screening for malignant neoplasm of colon: Secondary | ICD-10-CM

## 2020-08-10 DIAGNOSIS — R7303 Prediabetes: Secondary | ICD-10-CM | POA: Diagnosis not present

## 2020-08-10 DIAGNOSIS — Z0000 Encounter for general adult medical examination without abnormal findings: Secondary | ICD-10-CM | POA: Diagnosis not present

## 2020-08-10 DIAGNOSIS — E611 Iron deficiency: Secondary | ICD-10-CM

## 2020-08-10 DIAGNOSIS — R7989 Other specified abnormal findings of blood chemistry: Secondary | ICD-10-CM

## 2020-08-10 NOTE — Patient Instructions (Signed)

## 2020-08-10 NOTE — Progress Notes (Signed)
Female Physical   Impression and Recommendations:    1. Healthcare maintenance   2. Screening for colon cancer   3. Pre-diabetes   4. Elevated LDL cholesterol level   5. Elevated LFTs   6. Iron deficiency   7. BMI 40.0-44.9, adult (HCC)      1) Anticipatory Guidance: Skin CA prevention- recommend to use sunscreen when outside along with skin surveillance; eating a balanced and modest diet; physical activity at least 25 minutes per day or minimum of 150 min/ week moderate to intense activity.  2) Immunizations / Screenings / Labs:   All immunizations are up-to-date per recommendations or will be updated today if pt allows.    - Patient understands with dental and vision screens they will schedule independently.  - Will obtain CBC, CMP, HgA1c, Lipid panel, TSH and vit D when fasting. - UTD on mammogram, pap smear, Tdap. - Declined Influenza vaccine, Hep C and HIV screenings. - Completed Covid series.   3) Weight: Recommend to improve diet habits to improve overall feelings of well being and objective health data. Improve nutrient density of diet through increasing intake of fruits and vegetables and decreasing saturated fats, white flour products and refined sugars.  4) Healthcare Maintenance:  -Continue current medication regimen. (Per med review OB-GYN refilling medication, discussed with patient can continue management of medication therapy if OB-GYN prefers). -Continue with walking regimen, monitor carbohydrates and glucose and follow a heart healthy diet. -Stay well hydrated. -Recommend to use OTC Debrox ear drops for cerumen removal. -Follow up in 4 months for HTN, HLD (elevated LDL), PreDM and FBW few days prior.   No orders of the defined types were placed in this encounter.   Orders Placed This Encounter  Procedures   CBC   Comprehensive metabolic panel   TSH   Lipid panel   Hemoglobin A1c   Ambulatory referral to Gastroenterology     Return in about 4  months (around 12/09/2020) for HTN, HLD, PreDM and FBW few days prior.     Gross side effects, risk and benefits, and alternatives of medications discussed with patient.  Patient is aware that all medications have potential side effects and we are unable to predict every side effect or drug-drug interaction that may occur.  Expresses verbal understanding and consents to current therapy plan and treatment regimen.  F-up preventative CPE in 1 year- this is in addition to any chronic care visits.    Please see orders placed and AVS handed out to patient at the end of our visit for further patient instructions/ counseling done pertaining to today's office visit.     Subjective:     CPE HPI: Chelsea Robbins is a 49 y.o. female who presents to [place] [facility] today for a yearly health maintenance exam.   Health Maintenance Summary  - Reviewed and updated, unless pt declines services.  Last Cologuard or Colonoscopy:    Placed order for screening colonoscopy. Family history of Colon CA: N  Tobacco History Reviewed:  Y, never a smoker  Alcohol and/or drug use:    No concerns; no use Exercise Habits:  Walking Eye exams:Y Female Health:  PAP Smear - last known results: 09/09/2018- normal, Followed by Ob-Gyn STD concerns:   none Birth control method: tubal ligation Lumps or breast concerns:    none Breast Cancer Family History:  Y   Additional concerns beyond health maintenance issues: none     Immunization History  Administered Date(s) Administered  Tdap 06/11/2015     Health Maintenance  Topic Date Due   Hepatitis C Screening  Never done   PNEUMOCOCCAL POLYSACCHARIDE VACCINE AGE 18-64 HIGH RISK  Never done   COVID-19 Vaccine (1) Never done   OPHTHALMOLOGY EXAM  Never done   URINE MICROALBUMIN  Never done   HIV Screening  Never done   INFLUENZA VACCINE  Never done   FOOT EXAM  07/08/2020   HEMOGLOBIN A1C  12/10/2020   PAP SMEAR-Modifier   09/09/2021   TETANUS/TDAP  06/10/2025     Wt Readings from Last 3 Encounters:  08/10/20 252 lb 6.4 oz (114.5 kg)  11/06/19 270 lb (122.5 kg)  09/10/19 275 lb (124.7 kg)   BP Readings from Last 3 Encounters:  08/10/20 126/74  11/06/19 112/75  09/10/19 129/86   Pulse Readings from Last 3 Encounters:  08/10/20 71  11/06/19 71  09/10/19 64     Past Medical History:  Diagnosis Date   Diabetes mellitus without complication (HCC)    prediabetes   Diabetic peripheral neuropathy (HCC) 02/19/2019   Elevated hemoglobin A1c    Hypertension    Neck pain    Plantar fasciitis       Past Surgical History:  Procedure Laterality Date   CESAREAN SECTION     FOOT SURGERY     Bilateral   TONSILLECTOMY     TUBAL LIGATION        Family History  Problem Relation Age of Onset   Cancer Mother        GYN   Drug abuse Father    Cancer Father    Cancer Maternal Aunt        breast, lung   Cancer Maternal Grandmother        brain   Cancer Maternal Grandfather        throat      Social History   Substance and Sexual Activity  Drug Use Not Currently  ,   Social History   Substance and Sexual Activity  Alcohol Use Yes   Comment: Rarely   ,   Social History   Tobacco Use  Smoking Status Never Smoker  Smokeless Tobacco Never Used  ,   Social History   Substance and Sexual Activity  Sexual Activity Yes   Birth control/protection: Surgical    Current Outpatient Medications on File Prior to Visit  Medication Sig Dispense Refill   Cholecalciferol (VITAMIN D3 PO) Take by mouth. Takes between 2000 and 5000 units.     Ferrous Sulfate (IRON) 325 (65 Fe) MG TABS Take by mouth. 1 tab per week     triamterene-hydrochlorothiazide (MAXZIDE-25) 37.5-25 MG tablet Take 1 tablet by mouth every morning.  3   No current facility-administered medications on file prior to visit.    Allergies: Diclofenac  Review of Systems: General:   Denies fever,  chills, unexplained weight loss.  Optho/Auditory:   Denies visual changes, blurred vision/LOV Respiratory:   Denies SOB, DOE more than baseline levels.   Cardiovascular:   Denies chest pain, palpitations, new onset peripheral edema  Gastrointestinal:   Denies nausea, vomiting, diarrhea.  Genitourinary: Denies dysuria, freq/ urgency, flank pain or discharge from genitals.  Endocrine:     Denies hot or cold intolerance, polyuria, polydipsia. Musculoskeletal:   Denies unexplained myalgias, joint swelling, gait problems.  Skin:  Denies rash, suspicious lesions Neurological:     Denies dizziness, unexplained weakness, numbness  Psychiatric/Behavioral:   Denies mood changes, suicidal or homicidal ideations, hallucinations  Objective:    Blood pressure 126/74, pulse 71, height 5\' 5"  (1.651 m), weight 252 lb 6.4 oz (114.5 kg), SpO2 97 %. Body mass index is 42 kg/m. General Appearance:    Alert, cooperative, no distress, appears stated age  Head:    Normocephalic, without obvious abnormality, atraumatic  Eyes:    PERRL, conjunctiva/corneas clear, EOM's intact, both eyes  Ears:    Normal TM's and external ear canals, left ear; some excessive cerumen in right ear  Nose:   Nares normal, septum midline, mucosa normal, no drainage    or sinus tenderness  Throat:   Lips w/o lesion, mucosa moist, and tongue normal; teeth and   gums normal  Neck:   Supple, symmetrical, trachea midline, no adenopathy;    thyroid:  no enlargement/tenderness/nodules; no JVD  Back:     Symmetric, no curvature, ROM normal, no CVA tenderness  Lungs:     Clear to auscultation bilaterally, respirations unlabored, no       Wh/ R/ R  Chest Wall:    No tenderness or gross deformity; normal excursion   Heart:    Regular rate and rhythm, S1 and S2 normal, no murmur, rub   or gallop  Breast Exam:    Deferred to Ob-Gyn  Abdomen:     Soft, non-tender, bowel sounds active all four quadrants, No  G/R/R, no masses, no organomegaly   Genitalia:    Deferred to Ob-Gyn  Rectal:    Deferred to Ob-Gyn  Extremities:   Extremities normal, atraumatic, no cyanosis or gross edema  Pulses:   2+ and symmetric all extremities  Skin:   Warm, dry, Skin color, texture, turgor normal, no obvious rashes or lesions Psych: No HI/SI, judgement and insight good, Euthymic mood. Full Affect.  Neurologic:   CNII-XII grossly intact, normal strength, sensation and reflexes throughout

## 2020-08-28 HISTORY — PX: COLONOSCOPY: SHX174

## 2020-09-15 ENCOUNTER — Encounter: Payer: Self-pay | Admitting: Gastroenterology

## 2020-10-14 LAB — HM MAMMOGRAPHY

## 2020-10-21 ENCOUNTER — Other Ambulatory Visit: Payer: Self-pay

## 2020-10-21 ENCOUNTER — Ambulatory Visit (AMBULATORY_SURGERY_CENTER): Payer: 59

## 2020-10-21 VITALS — Ht 65.0 in | Wt 250.0 lb

## 2020-10-21 DIAGNOSIS — Z1211 Encounter for screening for malignant neoplasm of colon: Secondary | ICD-10-CM

## 2020-10-21 MED ORDER — PLENVU 140 G PO SOLR: 1.0000 | ORAL | 0 refills | Status: DC

## 2020-10-21 NOTE — Progress Notes (Signed)
Pt verified name, DOB, address and insurance during PV today.   Pt mailed instruction packet to included paper to complete and mail back to [facility] with addressed and stamped envelope, Emmi video, copy of consent form to read and not return, and instructions. [employer] coupon mailed in packet. PV completed over the phone. Pt encouraged to call with questions or issues   No allergies to soy or egg Pt is not on blood thinners or diet pills Denies issues with sedation/intubation Denies atrial flutter/fib Denies constipation   Pt is aware of Covid safety and care partner requirements.  On no meds for DM

## 2020-10-22 ENCOUNTER — Encounter: Payer: Self-pay | Admitting: Gastroenterology

## 2020-10-26 ENCOUNTER — Encounter: Payer: Self-pay | Admitting: Physician Assistant

## 2020-11-05 ENCOUNTER — Other Ambulatory Visit: Payer: Self-pay

## 2020-11-05 ENCOUNTER — Other Ambulatory Visit: Payer: Self-pay | Admitting: Gastroenterology

## 2020-11-05 ENCOUNTER — Ambulatory Visit (AMBULATORY_SURGERY_CENTER): Payer: 59 | Admitting: Gastroenterology

## 2020-11-05 ENCOUNTER — Encounter: Payer: Self-pay | Admitting: Gastroenterology

## 2020-11-05 VITALS — BP 108/55 | HR 70 | Temp 97.1°F | Resp 16 | Ht 65.0 in | Wt 250.0 lb

## 2020-11-05 DIAGNOSIS — K635 Polyp of colon: Secondary | ICD-10-CM

## 2020-11-05 DIAGNOSIS — K6289 Other specified diseases of anus and rectum: Secondary | ICD-10-CM | POA: Diagnosis not present

## 2020-11-05 DIAGNOSIS — K621 Rectal polyp: Secondary | ICD-10-CM | POA: Diagnosis not present

## 2020-11-05 DIAGNOSIS — Z1211 Encounter for screening for malignant neoplasm of colon: Secondary | ICD-10-CM | POA: Diagnosis present

## 2020-11-05 DIAGNOSIS — D129 Benign neoplasm of anus and anal canal: Secondary | ICD-10-CM

## 2020-11-05 DIAGNOSIS — D128 Benign neoplasm of rectum: Secondary | ICD-10-CM

## 2020-11-05 DIAGNOSIS — D122 Benign neoplasm of ascending colon: Secondary | ICD-10-CM

## 2020-11-05 MED ORDER — SODIUM CHLORIDE 0.9 % IV SOLN: 500.0000 mL | INTRAVENOUS | Status: DC

## 2020-11-05 NOTE — Progress Notes (Signed)
Called to room to assist during endoscopic procedure.  Patient ID and intended procedure confirmed with present staff. Received instructions for my participation in the procedure from the performing physician.  

## 2020-11-05 NOTE — Op Note (Signed)
[facility] Patient Name: Chelsea Robbins Procedure Date: 11/05/2020 7:32 AM MRN: [id number] Endoscopist: Justice Britain , MD Age: 50 Referring MD:  Date of Birth: Nov 04, 1970 Gender: Female Account #: [id number] Procedure:                Colonoscopy Indications:              Screening for colorectal malignant neoplasm, This                            is the patient's first colonoscopy Medicines:                Monitored Anesthesia Care Procedure:                Pre-Anesthesia Assessment:                           - Prior to the procedure, a History and Physical                            was performed, and patient medications and                            allergies were reviewed. The patient's tolerance of                            previous anesthesia was also reviewed. The risks                            and benefits of the procedure and the sedation                            options and risks were discussed with the patient.                            All questions were answered, and informed consent                            was obtained. Prior Anticoagulants: The patient has                            taken no previous anticoagulant or antiplatelet                            agents. ASA Grade Assessment: III - A patient with                            severe systemic disease. After reviewing the risks                            and benefits, the patient was deemed in                            satisfactory condition to undergo the procedure.  After obtaining informed consent, the colonoscope                            was passed under direct vision. Throughout the                            procedure, the patient's blood pressure, pulse, and                            oxygen saturations were monitored continuously. The                            Olympus CF-HQ190L [id number]) Colonoscope was                            introduced through the  anus and advanced to the the                            cecum, identified by appendiceal orifice and                            ileocecal valve. The colonoscopy was performed                            without difficulty. The patient tolerated the                            procedure. Scope In: 8:12:56 AM Scope Out: 5:46:27 AM Scope Withdrawal Time: 0 hours 15 minutes 55 seconds  Total Procedure Duration: 0 hours 24 minutes 0 seconds  Findings:                 The digital rectal exam findings include                            hemorrhoids. Pertinent negatives include no                            palpable rectal lesions.                           The colon (entire examined portion) was                            significantly redundant.                           A single small-mouthed diverticulum was found at                            the hepatic flexure.                           A 12 mm polypoid lesion was found on the ascending  colon side of the ileocecal valve. The lesion was                            sessile. The lesion was removed using a cold snare.                            Resection and retrieval were complete.                           Three sessile polyps were found in the rectum. The                            polyps were 1 to 2 mm in size. These polyps were                            removed with a cold snare. Resection and retrieval                            were complete.                           Normal mucosa was found in the entire colon                            otherwise.                           Non-bleeding non-thrombosed external and internal                            hemorrhoids were found during retroflexion, during                            perianal exam and during digital exam. The                            hemorrhoids were Grade II (internal hemorrhoids                            that prolapse but reduce  spontaneously). Complications:            No immediate complications. Estimated Blood Loss:     Estimated blood loss was minimal. Impression:               - Hemorrhoids found on digital rectal exam.                           - Redundant colon.                           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                           - One, 12 mm polyp at the ileocecal valve, removed  piecemeal using a cold snare. Resected and                            retrieved.                           - Three, 1 to 2 mm polyps in the rectum, removed                            with a cold snare. Resected and retrieved.                           - Normal mucosa in the entire examined colon                            otherwise.                           - Non-bleeding non-thrombosed external and internal                            hemorrhoids. Recommendation:           - The patient will be observed post-procedure,                            until all discharge criteria are met.                           - Discharge patient to home.                           - Patient has a contact number available for                            emergencies. The signs and symptoms of potential                            delayed complications were discussed with the                            patient. Return to normal activities tomorrow.                            Written discharge instructions were provided to the                            patient.                           - High fiber diet.                           - Use FiberCon 1-2 tablets PO daily.                           - Continue present medications.                           -  Await pathology results.                           - Repeat colonoscopy based on pathology results but                            if adenomatous tissue is found in larger polyp then                            will be a 3-year follow up.                            - The findings and recommendations were discussed                            with the patient.                           - The findings and recommendations were discussed                            with the patient's family. Justice Britain, MD 11/05/2020 8:45:56 AM

## 2020-11-05 NOTE — Patient Instructions (Signed)
Continue present medications Start high fiber diet, use fibercon 1-2 tablets by mouth daily Await pathology results Repeat colonoscopy based on path results.  YOU HAD AN ENDOSCOPIC PROCEDURE TODAY AT [facility]:   Refer to the procedure report that was given to you for any specific questions about what was found during the examination.  If the procedure report does not answer your questions, please call your gastroenterologist to clarify.  If you requested that your care partner not be given the details of your procedure findings, then the procedure report has been included in a sealed envelope for you to review at your convenience later.  YOU SHOULD EXPECT: Some feelings of bloating in the abdomen. Passage of more gas than usual.  Walking can help get rid of the air that was put into your GI tract during the procedure and reduce the bloating. If you had a lower endoscopy (such as a colonoscopy or flexible sigmoidoscopy) you may notice spotting of blood in your stool or on the toilet paper. If you underwent a bowel prep for your procedure, you may not have a normal bowel movement for a few days.  Please Note:  You might notice some irritation and congestion in your nose or some drainage.  This is from the oxygen used during your procedure.  There is no need for concern and it should clear up in a day or so.  SYMPTOMS TO REPORT IMMEDIATELY:   Following lower endoscopy (colonoscopy or flexible sigmoidoscopy):  Excessive amounts of blood in the stool  Significant tenderness or worsening of abdominal pains  Swelling of the abdomen that is new, acute  Fever of 100F or higher  For urgent or emergent issues, a gastroenterologist can be reached at any hour by calling [phone]. Do not use MyChart messaging for urgent concerns.    DIET:  We do recommend a small meal at first, but then you may proceed to your regular diet.  Drink plenty of fluids but you should avoid alcoholic  beverages for 24 hours.  ACTIVITY:  You should plan to take it easy for the rest of today and you should NOT DRIVE or use heavy machinery until tomorrow (because of the sedation medicines used during the test).    FOLLOW UP: Our staff will call the number listed on your records 48-72 hours following your procedure to check on you and address any questions or concerns that you may have regarding the information given to you following your procedure. If we do not reach you, we will leave a message.  We will attempt to reach you two times.  During this call, we will ask if you have developed any symptoms of COVID 19. If you develop any symptoms (ie: fever, flu-like symptoms, shortness of breath, cough etc.) before then, please call [phone].  If you test positive for Covid 19 in the 2 weeks post procedure, please call and report this information to [place].    If any biopsies were taken you will be contacted by phone or by letter within the next 1-3 weeks.  Please call us at [phone] if you have not heard about the biopsies in 3 weeks.    SIGNATURES/CONFIDENTIALITY: You and/or your care partner have signed paperwork which will be entered into your electronic medical record.  These signatures attest to the fact that that the information above on your After Visit Summary has been reviewed and is understood.  Full responsibility of the confidentiality of this discharge information lies  with you and/or your care-partner.

## 2020-11-05 NOTE — Progress Notes (Signed)
pt tolerated well. VSS. awake and to recovery. Report given to RN.  

## 2020-11-10 ENCOUNTER — Telehealth: Payer: Self-pay

## 2020-11-10 NOTE — Telephone Encounter (Signed)
  Follow up Call-  Call back number 11/05/2020  Post procedure Call Back phone  # [phone]  Permission to leave phone message Yes  Some recent data might be hidden     Patient questions:  Do you have a fever, pain , or abdominal swelling? No. Pain Score  0 *  Have you tolerated food without any problems? Yes.    Have you been able to return to your normal activities? Yes.    Do you have any questions about your discharge instructions: Diet   No. Medications  No. Follow up visit  No.  Do you have questions or concerns about your Care? No.  Actions: * If pain score is 4 or above: No action needed, pain <4. 1. Have you developed a fever since your procedure? no  2.   Have you had an respiratory symptoms (SOB or cough) since your procedure? no  3.   Have you tested positive for COVID 19 since your procedure no  4.   Have you had any family members/close contacts diagnosed with the COVID 19 since your procedure?  no   If yes to any of these questions please route to Joylene John, RN and Joella Prince, RN

## 2020-11-17 ENCOUNTER — Encounter: Payer: Self-pay | Admitting: Gastroenterology

## 2020-11-18 ENCOUNTER — Telehealth: Payer: Self-pay | Admitting: Physician Assistant

## 2020-11-18 NOTE — Telephone Encounter (Signed)
Accuretic (quinapril HCl/hydrochlorothiazide) tablets, 20/12.5 mg  NDC: 0071-5212-23; Lot number: [id number]; Expiration date: August 2024 [place]: 0071-0220-23; Lot number: [id number]; Expiration date: April 2022 [place]: 0071-0220-23; Lot number: [id number]; Expiration date: April 2022 Accuretic (quinapril HCl/hydrochlorothiazide) tablets, 20/25 mg  NDC: 0071-0223-23; Lot number: [id number]; Expiration date: February 2023 Quinapril and hydrochlorothiazide tablets, 20/25 mg:  [facility]: [address]-1; Lot number: [id number]; Expiration date: February 2023 Quinapril HCl/hydrochlorothiazide tablets, 20/12.5 mg:  [facility]: [address]-7; Lot number: [id number]; Expiration date: March 2023 [place]: [address]-1; Lot number: [id number]; Expiration date: March 2023 [place]: [address]-4; Lot number: [id number]; Expiration date: March 2023 Quinapril HCl/hydrochlorothiazide tablets, 20/25 mg  NDC: [address]-3; Lot number: [id number]; Expiration date: February 2023     Patient is aware recall is for Quinapril with HCTZ. Patient advised to contact pharmacy as well. Pt verbalized understanding. AS, CMA

## 2020-11-25 ENCOUNTER — Other Ambulatory Visit: Payer: Self-pay | Admitting: Physician Assistant

## 2020-11-25 DIAGNOSIS — R7303 Prediabetes: Secondary | ICD-10-CM

## 2020-11-25 DIAGNOSIS — E7800 Pure hypercholesterolemia, unspecified: Secondary | ICD-10-CM

## 2020-11-25 DIAGNOSIS — Z0000 Encounter for general adult medical examination without abnormal findings: Secondary | ICD-10-CM

## 2020-11-25 DIAGNOSIS — I10 Essential (primary) hypertension: Secondary | ICD-10-CM

## 2020-11-25 DIAGNOSIS — R7989 Other specified abnormal findings of blood chemistry: Secondary | ICD-10-CM

## 2020-12-02 ENCOUNTER — Other Ambulatory Visit: Payer: Self-pay

## 2020-12-02 ENCOUNTER — Other Ambulatory Visit: Payer: 59

## 2020-12-02 DIAGNOSIS — Z0000 Encounter for general adult medical examination without abnormal findings: Secondary | ICD-10-CM

## 2020-12-02 DIAGNOSIS — R7989 Other specified abnormal findings of blood chemistry: Secondary | ICD-10-CM

## 2020-12-02 DIAGNOSIS — R7303 Prediabetes: Secondary | ICD-10-CM

## 2020-12-02 DIAGNOSIS — I10 Essential (primary) hypertension: Secondary | ICD-10-CM

## 2020-12-02 DIAGNOSIS — E7800 Pure hypercholesterolemia, unspecified: Secondary | ICD-10-CM

## 2020-12-03 LAB — CBC
Hematocrit: 39.3 % (ref 34.0–46.6)
Hemoglobin: 12.4 g/dL (ref 11.1–15.9)
MCH: 25.5 pg — ABNORMAL LOW (ref 26.6–33.0)
MCHC: 31.6 g/dL (ref 31.5–35.7)
MCV: 81 fL (ref 79–97)
Platelets: 304 10*3/uL (ref 150–450)
RBC: 4.86 x10E6/uL (ref 3.77–5.28)
RDW: 14.6 % (ref 11.7–15.4)
WBC: 7 10*3/uL (ref 3.4–10.8)

## 2020-12-03 LAB — LIPID PANEL
Chol/HDL Ratio: 3.9 ratio (ref 0.0–4.4)
Cholesterol, Total: 205 mg/dL — ABNORMAL HIGH (ref 100–199)
HDL: 53 mg/dL (ref >39)
LDL Chol Calc (NIH): 131 mg/dL — ABNORMAL HIGH (ref 0–99)
Triglycerides: 120 mg/dL (ref 0–149)
VLDL Cholesterol Cal: 21 mg/dL (ref 5–40)

## 2020-12-03 LAB — COMPREHENSIVE METABOLIC PANEL
ALT: 13 IU/L (ref 0–32)
AST: 13 IU/L (ref 0–40)
Albumin/Globulin Ratio: 1.7 (ref 1.2–2.2)
Albumin: 4 g/dL (ref 3.8–4.8)
Alkaline Phosphatase: 86 IU/L (ref 44–121)
BUN/Creatinine Ratio: 26 — ABNORMAL HIGH (ref 9–23)
BUN: 23 mg/dL (ref 6–24)
Bilirubin Total: 0.4 mg/dL (ref 0.0–1.2)
CO2: 20 mmol/L (ref 20–29)
Calcium: 9.2 mg/dL (ref 8.7–10.2)
Chloride: 105 mmol/L (ref 96–106)
Creatinine, Ser: 0.89 mg/dL (ref 0.57–1.00)
Globulin, Total: 2.4 g/dL (ref 1.5–4.5)
Glucose: 92 mg/dL (ref 65–99)
Potassium: 4.2 mmol/L (ref 3.5–5.2)
Sodium: 141 mmol/L (ref 134–144)
Total Protein: 6.4 g/dL (ref 6.0–8.5)
eGFR: 79 mL/min/{1.73_m2} (ref >59)

## 2020-12-03 LAB — HEMOGLOBIN A1C
Est. average glucose Bld gHb Est-mCnc: 114 mg/dL
Hgb A1c MFr Bld: 5.6 % (ref 4.8–5.6)

## 2020-12-03 LAB — TSH: TSH: 2.5 u[IU]/mL (ref 0.450–4.500)

## 2020-12-09 ENCOUNTER — Encounter: Payer: Self-pay | Admitting: Physician Assistant

## 2020-12-09 ENCOUNTER — Other Ambulatory Visit: Payer: Self-pay

## 2020-12-09 ENCOUNTER — Ambulatory Visit (INDEPENDENT_AMBULATORY_CARE_PROVIDER_SITE_OTHER): Payer: 59 | Admitting: Physician Assistant

## 2020-12-09 VITALS — BP 131/80 | HR 71 | Temp 99.0°F | Ht 65.0 in | Wt 249.3 lb

## 2020-12-09 DIAGNOSIS — R7303 Prediabetes: Secondary | ICD-10-CM

## 2020-12-09 DIAGNOSIS — I10 Essential (primary) hypertension: Secondary | ICD-10-CM | POA: Diagnosis not present

## 2020-12-09 DIAGNOSIS — R0789 Other chest pain: Secondary | ICD-10-CM

## 2020-12-09 DIAGNOSIS — E7800 Pure hypercholesterolemia, unspecified: Secondary | ICD-10-CM | POA: Diagnosis not present

## 2020-12-09 NOTE — Progress Notes (Signed)
Established Patient Office Visit  Subjective:  Patient ID: Chelsea Robbins, female    DOB: 05-Jul-1971  Age: 50 y.o. MRN: [id number]  CC:  Chief Complaint  Patient presents with  . Hypertension  . Hyperlipidemia  . Prediabetes    HPI Chelsea Robbins presents for follow up on hypertension, hyperlipidemia and prediabetes. Pt has c/o intermittent chest pain for the past few weeks. States pain moves from one location to another, no specific place. Sometimes also has back pain right in the center of her spine and at times feels like it goes through to the front of her chest. Denies palpitations, dizziness, LOC, nausea, vomiting, bowel changes or dyspnea. Does report increased personal stress to down sizing and selling her house. Also reports history of heartburn.  HTN: Pt denies palpitations, dizziness or lower extremity swelling. Taking medication as directed without side effects. Does not check BP at home. Medication is managed by OB/GYN.  HLD: Pt is trying to manage with diet changes. Has worked on reducing carbohydrates and fats. States eats pork and red meat few times per week and has tried to cook more chicken. Reports got sick with eating salmon so has avoided fish.  Prediabetes: Patient reports has not been as active with walking but plans to resume regimen as the weather gets warmer.    Past Medical History:  Diagnosis Date  . Anemia    anemia  . Blood transfusion without reported diagnosis    1993 at birth of daughter  . Diabetes mellitus without complication (HCC)    prediabetes  . Diabetic peripheral neuropathy ([place]) 02/19/2019  . Elevated hemoglobin A1c   . Heart murmur    noted 7-10 yrs  . Hypertension   . Neck pain   . Plantar fasciitis     Past Surgical History:  Procedure Laterality Date  . CESAREAN SECTION    . FOOT SURGERY     Bilateral  . TONSILLECTOMY    . TUBAL LIGATION      Family History  Problem Relation Age of Onset  . Cancer Mother        GYN  .  Drug abuse Father   . Cancer Father   . Cancer Maternal Aunt        breast, lung  . Cancer Maternal Grandmother        brain  . Cancer Maternal Grandfather        throat  . Esophageal cancer Maternal Grandfather 4  . Colon cancer Neg Hx   . Colon polyps Neg Hx   . Rectal cancer Neg Hx   . Stomach cancer Neg Hx     Social History   Socioeconomic History  . Marital status: Married    Spouse name: Not on file  . Number of children: Not on file  . Years of education: Not on file  . Highest education level: Not on file  Occupational History  . Not on file  Tobacco Use  . Smoking status: Never Smoker  . Smokeless tobacco: Never Used  Vaping Use  . Vaping Use: Never used  Substance and Sexual Activity  . Alcohol use: Yes    Comment: Rarely   . Drug use: Not Currently  . Sexual activity: Yes    Birth control/protection: Surgical  Other Topics Concern  . Not on file  Social History Narrative   Right handed    Caffeine 2 -3 cups daily    Lives at home with husband and 1 adult.  3 children   Social Determinants of Health   Financial Resource Strain: Not on file  Food Insecurity: Not on file  Transportation Needs: Not on file  Physical Activity: Not on file  Stress: Not on file  Social Connections: Not on file  Intimate Partner Violence: Not on file    Outpatient Medications Prior to Visit  Medication Sig Dispense Refill  . Cholecalciferol (VITAMIN D3 PO) Take by mouth. Takes between 2000 and 5000 units.    . Ferrous Sulfate (IRON) 325 (65 Fe) MG TABS Take by mouth. 1 tab per week    . Pramoxine-HC (HYDROCORTISONE ACE-PRAMOXINE) 2.5-1 % CREA hydrocortisone-pramoxine 2.5 %-1 % rectal cream  Apply up to twice daily    . triamterene-hydrochlorothiazide (MAXZIDE-25) 37.5-25 MG tablet Take 1 tablet by mouth every morning.  3  . PEG-KCl-NaCl-NaSulf-Na Asc-C (PLENVU) 140 g SOLR Take 1 kit by mouth as directed. (Patient not taking: Reported on 12/09/2020) 1 each 0    Facility-Administered Medications Prior to Visit  Medication Dose Route Frequency Provider Last Rate Last Admin  . 0.9 %  sodium chloride infusion  500 mL Intravenous Continuous Mansouraty, Telford Nab., MD        Allergies  Allergen Reactions  . Diclofenac     Flushing     ROS Review of Systems Review of Systems:  A fourteen system review of systems was performed and found to be positive as per HPI.  Objective:    Physical Exam General:  Well Developed, well nourished, in no acute distress  Neuro:  Alert and oriented,  extra-ocular muscles intact  HEENT:  Normocephalic, atraumatic, neck supple  Skin:  no gross rash, warm, pink. Abdomen: Nontender, nondistended, negative Murphy's sign, negative CVA b/l Cardiac:  RRR, S1 S2 wnl's. No murmur Respiratory:  ECTA B/L w/o wheezing, Not using accessory muscles, speaking in full sentences- unlabored. MSK: No tenderness of chest wall or back, good ROM of UE Vascular:  Ext warm, no cyanosis apprec.; cap RF less 2 sec. Psych:  No HI/SI, judgement and insight good, Euthymic mood. Full Affect.   BP 131/80   Pulse 71   Temp 99 F (37.2 C)   Ht 5' 5"  (1.651 m)   Wt 249 lb 4.8 oz (113.1 kg)   LMP  (LMP Unknown)   SpO2 97%   BMI 41.49 kg/m  Wt Readings from Last 3 Encounters:  12/09/20 249 lb 4.8 oz (113.1 kg)  11/05/20 250 lb (113.4 kg)  10/21/20 250 lb (113.4 kg)     Health Maintenance Due  Topic Date Due  . Hepatitis C Screening  Never done  . PNEUMOCOCCAL POLYSACCHARIDE VACCINE AGE 9-64 HIGH RISK  Never done  . COVID-19 Vaccine (1) Never done  . OPHTHALMOLOGY EXAM  Never done  . URINE MICROALBUMIN  Never done  . HIV Screening  Never done  . FOOT EXAM  07/08/2020    There are no preventive care reminders to display for this patient.  Lab Results  Component Value Date   TSH 2.500 12/02/2020   Lab Results  Component Value Date   WBC 7.0 12/02/2020   HGB 12.4 12/02/2020   HCT 39.3 12/02/2020   MCV 81 12/02/2020    PLT 304 12/02/2020   Lab Results  Component Value Date   NA 141 12/02/2020   K 4.2 12/02/2020   CO2 20 12/02/2020   GLUCOSE 92 12/02/2020   BUN 23 12/02/2020   CREATININE 0.89 12/02/2020   BILITOT 0.4 12/02/2020   ALKPHOS 86  12/02/2020   AST 13 12/02/2020   ALT 13 12/02/2020   PROT 6.4 12/02/2020   ALBUMIN 4.0 12/02/2020   CALCIUM 9.2 12/02/2020   Lab Results  Component Value Date   CHOL 205 (H) 12/02/2020   Lab Results  Component Value Date   HDL 53 12/02/2020   Lab Results  Component Value Date   LDLCALC 131 (H) 12/02/2020   Lab Results  Component Value Date   TRIG 120 12/02/2020   Lab Results  Component Value Date   CHOLHDL 3.9 12/02/2020   Lab Results  Component Value Date   HGBA1C 5.6 12/02/2020      Assessment & Plan:   Problem List Items Addressed This Visit      Cardiovascular and Mediastinum   Hypertension - Primary     Other   Elevated LDL cholesterol level    Other Visit Diagnoses    Prediabetes       Atypical chest pain         Atypical chest pain: -Performed an EKG: NSR, rate 61, no acute ST-T wave changes. -Patient is hemodynamically stable and currently asymptomatic.  Discussed potential etiologies including cardiopulmonary and pt defers referral to cardiology for further work-up at this time. Possibly symptoms are GERD related and recommend to take over-the-counter famotidine. -Recommend to seek immediate medical care if symptoms worsen or develops new symptoms such as shortness of breath, arm or jaw pain, or altered mental status.  Hypertension: -Fairly controlled. -Continue current medication regimen. -Discussed recent CMP, renal function and electrolytes normal. -Will continue to monitor.  Elevated LDL cholesterol level: -Discussed recent lipid panel, total cholesterol 205, triglycerides 120, HDL 53, LDL 131 -The 10-year ASCVD risk score Mikey Bussing DC Jr., et al., 2013) is: 3.3%   Values used to calculate the score:     Age:  69 years     Sex: Female     Is Non-Hispanic African American: No     Diabetic: Yes     Tobacco smoker: No     Systolic Blood Pressure: 537 mmHg     Is BP treated: Yes     HDL Cholesterol: 53 mg/dL     Total Cholesterol: 205 mg/dL -Recommend to continue to reduce saturated and transfats.  Increase physical activity.  Discussed to consider statin therapy of LDL fails to improve or worsen. -Will continue to monitor.  Prediabetes: -Recent A1c has improved from 5.7-5.6. -Recommend to continue to monitor carbohydrates and glucose. Increase physical activity. -Will continue to monitor.  No orders of the defined types were placed in this encounter.   Follow-up: Return in about 6 months (around 06/10/2021) for HLD, PreDM, HTN and FBW few days prior .   Note:  This note was prepared with assistance of Dragon voice recognition software. Occasional wrong-word or sound-a-like substitutions may have occurred due to the inherent limitations of voice recognition software.   Lorrene Reid, PA-C

## 2020-12-09 NOTE — Patient Instructions (Addendum)

## 2020-12-13 NOTE — Addendum Note (Signed)
Addended by: Mickel Crow on: 12/13/2020 09:55 AM   Modules accepted: Orders

## 2021-01-19 ENCOUNTER — Telehealth: Payer: Self-pay | Admitting: Physician Assistant

## 2021-01-19 NOTE — Telephone Encounter (Signed)
Patient called back stating her blood pressure was 159/72 and second time she checked it, it was 149/77. For documentation.

## 2021-01-19 NOTE — Telephone Encounter (Signed)
Patient requested a nurse call her. Patient has been having headaches for about four weeks now. Sometimes it is throbbing pain, but it is mostly dull pain. Sometimes patient's teeth ache as well. Please call patient, thanks.

## 2021-01-19 NOTE — Telephone Encounter (Signed)
Patient states she has had a headache x 1 month. Pt states she thinks it may be stress. Asked patient to take her BP and it was 177/82. Pt states she has had some dizziness in the past but denies any now. Patient denies chest pain, L arm pain, confusion, dizziness, neck pain, etc.  Per Helyn App advised patient to take an extra Maxide today and to recheck BP in 2-3 hrs. Patient added to schedule tomorrow at 1045am. Pt verbalized understanding and was agreeable. AS, CMA

## 2021-01-20 ENCOUNTER — Other Ambulatory Visit: Payer: Self-pay

## 2021-01-20 ENCOUNTER — Encounter: Payer: Self-pay | Admitting: Physician Assistant

## 2021-01-20 ENCOUNTER — Ambulatory Visit (INDEPENDENT_AMBULATORY_CARE_PROVIDER_SITE_OTHER): Payer: 59 | Admitting: Physician Assistant

## 2021-01-20 VITALS — BP 135/75 | HR 66 | Temp 98.9°F | Ht 65.0 in | Wt 244.4 lb

## 2021-01-20 DIAGNOSIS — I10 Essential (primary) hypertension: Secondary | ICD-10-CM | POA: Diagnosis not present

## 2021-01-20 DIAGNOSIS — G4452 New daily persistent headache (NDPH): Secondary | ICD-10-CM | POA: Diagnosis not present

## 2021-01-20 NOTE — Patient Instructions (Signed)
Managing Your Hypertension Hypertension, also called high blood pressure, is when the force of the blood pressing against the walls of the arteries is too strong. Arteries are blood vessels that carry blood from your heart throughout your body. Hypertension forces the heart to work harder to pump blood and may cause the arteries to become narrow or stiff. Understanding blood pressure readings Your personal target blood pressure may vary depending on your medical conditions, your age, and other factors. A blood pressure reading includes a higher number over a lower number. Ideally, your blood pressure should be below 120/80. You should know that:  The first, or top, number is called the systolic pressure. It is a measure of the pressure in your arteries as your heart beats.  The second, or bottom number, is called the diastolic pressure. It is a measure of the pressure in your arteries as the heart relaxes. Blood pressure is classified into four stages. Based on your blood pressure reading, your health care provider may use the following stages to determine what type of treatment you need, if any. Systolic pressure and diastolic pressure are measured in a unit called mmHg. Normal  Systolic pressure: below 120.  Diastolic pressure: below 80. Elevated  Systolic pressure: 120-129.  Diastolic pressure: below 80. Hypertension stage 1  Systolic pressure: 130-139.  Diastolic pressure: 80-89. Hypertension stage 2  Systolic pressure: 140 or above.  Diastolic pressure: 90 or above. How can this condition affect me? Managing your hypertension is an important responsibility. Over time, hypertension can damage the arteries and decrease blood flow to important parts of the body, including the brain, heart, and kidneys. Having untreated or uncontrolled hypertension can lead to:  A heart attack.  A stroke.  A weakened blood vessel (aneurysm).  Heart failure.  Kidney damage.  Eye  damage.  Metabolic syndrome.  Memory and concentration problems.  Vascular dementia. What actions can I take to manage this condition? Hypertension can be managed by making lifestyle changes and possibly by taking medicines. Your health care provider will help you make a plan to bring your blood pressure within a normal range. Nutrition  Eat a diet that is high in fiber and potassium, and low in salt (sodium), added sugar, and fat. An example eating plan is called the Dietary Approaches to Stop Hypertension (DASH) diet. To eat this way: ? Eat plenty of fresh fruits and vegetables. Try to fill one-half of your plate at each meal with fruits and vegetables. ? Eat whole grains, such as whole-wheat pasta, brown rice, or whole-grain bread. Fill about one-fourth of your plate with whole grains. ? Eat low-fat dairy products. ? Avoid fatty cuts of meat, processed or cured meats, and poultry with skin. Fill about one-fourth of your plate with lean proteins such as fish, chicken without skin, beans, eggs, and tofu. ? Avoid pre-made and processed foods. These tend to be higher in sodium, added sugar, and fat.  Reduce your daily sodium intake. Most people with hypertension should eat less than 1,500 mg of sodium a day.   Lifestyle  Work with your health care provider to maintain a healthy body weight or to lose weight. Ask what an ideal weight is for you.  Get at least 30 minutes of exercise that causes your heart to beat faster (aerobic exercise) most days of the week. Activities may include walking, swimming, or biking.  Include exercise to strengthen your muscles (resistance exercise), such as weight lifting, as part of your weekly exercise routine. Try   to do these types of exercises for 30 minutes at least 3 days a week.  Do not use any products that contain nicotine or tobacco, such as cigarettes, e-cigarettes, and chewing tobacco. If you need help quitting, ask your health care  provider.  Control any long-term (chronic) conditions you have, such as high cholesterol or diabetes.  Identify your sources of stress and find ways to manage stress. This may include meditation, deep breathing, or making time for fun activities.   Alcohol use  Do not drink alcohol if: ? Your health care provider tells you not to drink. ? You are pregnant, may be pregnant, or are planning to become pregnant.  If you drink alcohol: ? Limit how much you use to:  0-1 drink a day for women.  0-2 drinks a day for men. ? Be aware of how much alcohol is in your drink. In the [place] one drink equals one 12 oz bottle of beer (355 mL), one 5 oz glass of wine (148 mL), or one 1 oz glass of hard liquor (44 mL). Medicines Your health care provider may prescribe medicine if lifestyle changes are not enough to get your blood pressure under control and if:  Your systolic blood pressure is 130 or higher.  Your diastolic blood pressure is 80 or higher. Take medicines only as told by your health care provider. Follow the directions carefully. Blood pressure medicines must be taken as told by your health care provider. The medicine does not work as well when you skip doses. Skipping doses also puts you at risk for problems. Monitoring Before you monitor your blood pressure:  Do not smoke, drink caffeinated beverages, or exercise within 30 minutes before taking a measurement.  Use the bathroom and empty your bladder (urinate).  Sit quietly for at least 5 minutes before taking measurements. Monitor your blood pressure at home as told by your health care provider. To do this:  Sit with your back straight and supported.  Place your feet flat on the floor. Do not cross your legs.  Support your arm on a flat surface, such as a table. Make sure your upper arm is at heart level.  Each time you measure, take two or three readings one minute apart and record the results. You may also need to have your  blood pressure checked regularly by your health care provider.   General information  Talk with your health care provider about your diet, exercise habits, and other lifestyle factors that may be contributing to hypertension.  Review all the medicines you take with your health care provider because there may be side effects or interactions.  Keep all visits as told by your health care provider. Your health care provider can help you create and adjust your plan for managing your high blood pressure. Where to find more information  National Heart, Lung, and Blood Institute: www.nhlbi.nih.gov  American Heart Association: www.heart.org Contact a health care provider if:  You think you are having a reaction to medicines you have taken.  You have repeated (recurrent) headaches.  You feel dizzy.  You have swelling in your ankles.  You have trouble with your vision. Get help right away if:  You develop a severe headache or confusion.  You have unusual weakness or numbness, or you feel faint.  You have severe pain in your chest or abdomen.  You vomit repeatedly.  You have trouble breathing. These symptoms may represent a serious problem that is an emergency. Do not wait   to see if the symptoms will go away. Get medical help right away. Call your local emergency services (911 in the [place] Do not drive yourself to the hospital. Summary  Hypertension is when the force of blood pumping through your arteries is too strong. If this condition is not controlled, it may put you at risk for serious complications.  Your personal target blood pressure may vary depending on your medical conditions, your age, and other factors. For most people, a normal blood pressure is less than 120/80.  Hypertension is managed by lifestyle changes, medicines, or both.  Lifestyle changes to help manage hypertension include losing weight, eating a healthy, low-sodium diet, exercising more, stopping smoking, and  limiting alcohol. This information is not intended to replace advice given to you by your health care provider. Make sure you discuss any questions you have with your health care provider. Document Revised: 09/19/2019 Document Reviewed: 07/15/2019 Elsevier Patient Education  2021 Elsevier Inc.  

## 2021-01-20 NOTE — Progress Notes (Signed)
Acute Office Visit  Subjective:    Patient ID: Chelsea Robbins, female    DOB: [birth year], 50 y.o.   MRN: [id number]  Chief Complaint  Patient presents with  . Acute Visit  . Headache  . Hypertension    HPI Patient is in today for c/o of headache and elevated blood pressure.  Patient contacted our office yesterday for ongoing headache for least 4 weeks and was advised to check BP which was elevated at 177/82.  Instructed patient to take an extra dose of Maxide and repeat blood pressure 2 to 3 hours after. Patient contacted the office and blood pressure decreased to 159/72 and then 149/77.  Patient reports has been experiencing a dull headache most days for about a month.  States is under increased rest with moving and reiterating their new home.  Patient states headache is dull and constant and at times has intermittent sharp pain and rates her pain 7-8/10 during those episodes.  Headache seems to move locations, can be left-sided or right-sided, frontal or occipital.  Reports tried to take Aleve but developed an allergic reaction (rash, went to urgent care for evaluation) so has been taking Tylenol.  Reports sometimes will have a worse sensation on the left side and if headache is severe bright lights can make it worse.  Denies eye pain, vision changes, slurred speech, n/v, or prior history of migraines.  Reports has not been checking blood pressures at home until yesterday.  States 2 weeks ago felt like she could hear her heartbeat.  Denies chest pain, palpitations or dizziness.  Past Medical History:  Diagnosis Date  . Anemia    anemia  . Blood transfusion without reported diagnosis    1993 at birth of daughter  . Diabetes mellitus without complication (HCC)    prediabetes  . Diabetic peripheral neuropathy ([place]) 02/19/2019  . Elevated hemoglobin A1c   . Heart murmur    noted 7-10 yrs  . Hypertension   . Neck pain   . Plantar fasciitis     Past Surgical History:  Procedure  Laterality Date  . CESAREAN SECTION    . FOOT SURGERY     Bilateral  . TONSILLECTOMY    . TUBAL LIGATION      Family History  Problem Relation Age of Onset  . Cancer Mother        GYN  . Drug abuse Father   . Cancer Father   . Cancer Maternal Aunt        breast, lung  . Cancer Maternal Grandmother        brain  . Cancer Maternal Grandfather        throat  . Esophageal cancer Maternal Grandfather 36  . Colon cancer Neg Hx   . Colon polyps Neg Hx   . Rectal cancer Neg Hx   . Stomach cancer Neg Hx     Social History   Socioeconomic History  . Marital status: Married    Spouse name: Not on file  . Number of children: Not on file  . Years of education: Not on file  . Highest education level: Not on file  Occupational History  . Not on file  Tobacco Use  . Smoking status: Never Smoker  . Smokeless tobacco: Never Used  Vaping Use  . Vaping Use: Never used  Substance and Sexual Activity  . Alcohol use: Yes    Comment: Rarely   . Drug use: Not Currently  . Sexual activity: Yes  Birth control/protection: Surgical  Other Topics Concern  . Not on file  Social History Narrative   Right handed    Caffeine 2 -3 cups daily    Lives at home with husband and 1 adult.    3 children   Social Determinants of Health   Financial Resource Strain: Not on file  Food Insecurity: Not on file  Transportation Needs: Not on file  Physical Activity: Not on file  Stress: Not on file  Social Connections: Not on file  Intimate Partner Violence: Not on file    Outpatient Medications Prior to Visit  Medication Sig Dispense Refill  . Cholecalciferol (VITAMIN D3 PO) Take by mouth. Takes between 2000 and 5000 units.    . Pramoxine-HC (HYDROCORTISONE ACE-PRAMOXINE) 2.5-1 % CREA hydrocortisone-pramoxine 2.5 %-1 % rectal cream  Apply up to twice daily    . triamterene-hydrochlorothiazide (MAXZIDE-25) 37.5-25 MG tablet Take 2 tablets by mouth every morning.  3  . Ferrous Sulfate (IRON)  325 (65 Fe) MG TABS Take by mouth. 1 tab per week (Patient not taking: Reported on 01/20/2021)     Facility-Administered Medications Prior to Visit  Medication Dose Route Frequency Provider Last Rate Last Admin  . 0.9 %  sodium chloride infusion  500 mL Intravenous Continuous Mansouraty, Telford Nab., MD        Allergies  Allergen Reactions  . Diclofenac     Flushing     Review of Systems A fourteen system review of systems was performed and found to be positive as per HPI.    Objective:    Physical Exam General:  Well Developed, well nourished, appropriate for stated age.  Neuro:  Alert and oriented,  extra-ocular muscles intact, CN II-XII grossly intact  HEENT:  Normocephalic, atraumatic, neck supple Skin:  no gross rash, warm, pink. Cardiac:  RRR, S1 S2, no murmur Respiratory:  ECTA B/L, Not using accessory muscles, speaking in full sentences- unlabored. Vascular:  Ext warm, no cyanosis apprec.; cap RF less 2 sec. Psych:  No HI/SI, judgement and insight good, Euthymic mood. Full Affect.   BP 135/75   Pulse 66   Temp 98.9 F (37.2 C)   Ht 5' 5"  (1.651 m)   Wt 244 lb 6.4 oz (110.9 kg)   LMP  (LMP Unknown)   SpO2 98%   BMI 40.67 kg/m  Wt Readings from Last 3 Encounters:  01/20/21 244 lb 6.4 oz (110.9 kg)  12/09/20 249 lb 4.8 oz (113.1 kg)  11/05/20 250 lb (113.4 kg)    Health Maintenance Due  Topic Date Due  . PNEUMOCOCCAL POLYSACCHARIDE VACCINE AGE 76-64 HIGH RISK  Never done  . COVID-19 Vaccine (1) Never done  . OPHTHALMOLOGY EXAM  Never done  . URINE MICROALBUMIN  Never done  . HIV Screening  Never done  . Hepatitis C Screening  Never done  . FOOT EXAM  07/08/2020  . Zoster Vaccines- Shingrix (1 of 2) Never done    There are no preventive care reminders to display for this patient.   Lab Results  Component Value Date   TSH 2.500 12/02/2020   Lab Results  Component Value Date   WBC 7.0 12/02/2020   HGB 12.4 12/02/2020   HCT 39.3 12/02/2020   MCV 81  12/02/2020   PLT 304 12/02/2020   Lab Results  Component Value Date   NA 141 12/02/2020   K 4.2 12/02/2020   CO2 20 12/02/2020   GLUCOSE 92 12/02/2020   BUN 23 12/02/2020  CREATININE 0.89 12/02/2020   BILITOT 0.4 12/02/2020   ALKPHOS 86 12/02/2020   AST 13 12/02/2020   ALT 13 12/02/2020   PROT 6.4 12/02/2020   ALBUMIN 4.0 12/02/2020   CALCIUM 9.2 12/02/2020   EGFR 79 12/02/2020   Lab Results  Component Value Date   CHOL 205 (H) 12/02/2020   Lab Results  Component Value Date   HDL 53 12/02/2020   Lab Results  Component Value Date   LDLCALC 131 (H) 12/02/2020   Lab Results  Component Value Date   TRIG 120 12/02/2020   Lab Results  Component Value Date   CHOLHDL 3.9 12/02/2020   Lab Results  Component Value Date   HGBA1C 5.6 12/02/2020       Assessment & Plan:   Problem List Items Addressed This Visit      Cardiovascular and Mediastinum   Hypertension - Primary    Other Visit Diagnoses    New daily persistent headache         Hypertension: -Discussed with patient will continue with increased dose of MAXZIDE, BP today in office is stable. Advised to monitor for hypotensive episodes. Continue ambulatory BP/pulse monitoring and keep a log to bring to next OV.  -Advised to schedule lab visit to repeat CMP for medication monitoring with increased dose.  -Recommend to ensure adequate hydration and follow low sodium diet. -Follow up in 4 weeks.  New daily persistent headache: -Discussed with patient potential etiologies and possibly uncontrolled HTN contributing to symptoms. If headache fails to improve or worsen as blood pressure improves then recommend  further evaluation.  -Continue Tylenol as needed for pain relief. -Will reassess symptoms at follow up visit in 4 weeks.   No orders of the defined types were placed in this encounter.  Note:  This note was prepared with assistance of Dragon voice recognition software. Occasional wrong-word or  sound-a-like substitutions may have occurred due to the inherent limitations of voice recognition software.   Lorrene Reid, PA-C

## 2021-01-27 ENCOUNTER — Other Ambulatory Visit: Payer: Self-pay

## 2021-01-27 ENCOUNTER — Other Ambulatory Visit: Payer: 59

## 2021-01-27 DIAGNOSIS — Z0000 Encounter for general adult medical examination without abnormal findings: Secondary | ICD-10-CM

## 2021-01-27 DIAGNOSIS — I10 Essential (primary) hypertension: Secondary | ICD-10-CM

## 2021-01-28 LAB — COMPREHENSIVE METABOLIC PANEL
ALT: 17 IU/L (ref 0–32)
AST: 20 IU/L (ref 0–40)
Albumin/Globulin Ratio: 1.5 (ref 1.2–2.2)
Albumin: 4.3 g/dL (ref 3.8–4.8)
Alkaline Phosphatase: 90 IU/L (ref 44–121)
BUN/Creatinine Ratio: 17 (ref 9–23)
BUN: 17 mg/dL (ref 6–24)
Bilirubin Total: 0.4 mg/dL (ref 0.0–1.2)
CO2: 23 mmol/L (ref 20–29)
Calcium: 9.6 mg/dL (ref 8.7–10.2)
Chloride: 101 mmol/L (ref 96–106)
Creatinine, Ser: 1 mg/dL (ref 0.57–1.00)
Globulin, Total: 2.9 g/dL (ref 1.5–4.5)
Glucose: 103 mg/dL — ABNORMAL HIGH (ref 65–99)
Potassium: 4 mmol/L (ref 3.5–5.2)
Sodium: 139 mmol/L (ref 134–144)
Total Protein: 7.2 g/dL (ref 6.0–8.5)
eGFR: 69 mL/min/{1.73_m2} (ref >59)

## 2021-02-19 ENCOUNTER — Ambulatory Visit
Admission: EM | Admit: 2021-02-19 | Discharge: 2021-02-19 | Disposition: A | Discharge status: Home / Self Care | Payer: 59

## 2021-02-23 ENCOUNTER — Encounter: Payer: Self-pay | Admitting: Physician Assistant

## 2021-02-23 ENCOUNTER — Ambulatory Visit (INDEPENDENT_AMBULATORY_CARE_PROVIDER_SITE_OTHER): Payer: 59 | Admitting: Physician Assistant

## 2021-02-23 ENCOUNTER — Other Ambulatory Visit: Payer: Self-pay

## 2021-02-23 VITALS — BP 117/77 | HR 68 | Temp 98.5°F | Ht 65.0 in | Wt 242.5 lb

## 2021-02-23 DIAGNOSIS — I10 Essential (primary) hypertension: Secondary | ICD-10-CM | POA: Diagnosis not present

## 2021-02-23 NOTE — Progress Notes (Signed)
Established Patient Office Visit  Subjective:  Patient ID: Chelsea Robbins, female    DOB: Mar 31, 1971  Age: 50 y.o. MRN: [id number]  CC:  Chief Complaint  Patient presents with   Follow-up   Hypertension    HPI Jakeia Beckstead presents for follow upon hypertension. Patient reports went to UC over the weekend and was treated for a sinus infection with antibiotic, steroid and Singulair. States did not start Singulair due to potential side effects and OTC antihistamine medication helps with allergies. Reports is feeling better. Not having as much congestion and drainage.  HTN: Patient reports has been checking BP at home and started noticing readings were starting to be on the lower range of normal at 110s/50s so decided to take 1.5 tablet of her medication. However, recently was at the grocery and felt flushed and not well so decided to go to [place] and asked for her BP to be checked, and it was 150/70s. States took the other half of her medication that day to improve her blood pressure. Denies new onset of chest pain, palpitations, dizziness or headache. Continues to have ear ringing. States has moved in to her new house which has reduced some of her stress. Continues to monitor sodium intake.   Past Medical History:  Diagnosis Date   Anemia    anemia   Blood transfusion without reported diagnosis    1993 at birth of daughter   Diabetes mellitus without complication ([place])    prediabetes   Diabetic peripheral neuropathy ([place]) 02/19/2019   Elevated hemoglobin A1c    Heart murmur    noted 7-10 yrs   Hypertension    Neck pain    Plantar fasciitis     Past Surgical History:  Procedure Laterality Date   CESAREAN SECTION     FOOT SURGERY     Bilateral   TONSILLECTOMY     TUBAL LIGATION      Family History  Problem Relation Age of Onset   Cancer Mother        GYN   Drug abuse Father    Cancer Father    Cancer Maternal Aunt        breast, lung   Cancer Maternal Grandmother         brain   Cancer Maternal Grandfather        throat   Esophageal cancer Maternal Grandfather 62   Colon cancer Neg Hx    Colon polyps Neg Hx    Rectal cancer Neg Hx    Stomach cancer Neg Hx     Social History   Socioeconomic History   Marital status: Married    Spouse name: Not on file   Number of children: Not on file   Years of education: Not on file   Highest education level: Not on file  Occupational History   Not on file  Tobacco Use   Smoking status: Never   Smokeless tobacco: Never  Vaping Use   Vaping Use: Never used  Substance and Sexual Activity   Alcohol use: Yes    Comment: Rarely    Drug use: Not Currently   Sexual activity: Yes    Birth control/protection: Surgical  Other Topics Concern   Not on file  Social History Narrative   Right handed    Caffeine 2 -3 cups daily    Lives at home with husband and 1 adult.    3 children   Social Determinants of Health   Financial Resource Strain: Not  on file  Food Insecurity: Not on file  Transportation Needs: Not on file  Physical Activity: Not on file  Stress: Not on file  Social Connections: Not on file  Intimate Partner Violence: Not on file    Outpatient Medications Prior to Visit  Medication Sig Dispense Refill   amoxicillin-clavulanate (AUGMENTIN) 875-125 MG tablet SMARTSIG:1 Tablet(s) By Mouth Every 12 Hours     Cholecalciferol (VITAMIN D3 PO) Take by mouth. Takes between 2000 and 5000 units.     Ferrous Sulfate (IRON) 325 (65 Fe) MG TABS Take by mouth. 1 tab per week     montelukast (SINGULAIR) 10 MG tablet Take 10 mg by mouth at bedtime.     Pramoxine-HC (HYDROCORTISONE ACE-PRAMOXINE) 2.5-1 % CREA As needed     predniSONE (DELTASONE) 20 MG tablet Take 20 mg by mouth daily.     triamterene-hydrochlorothiazide (MAXZIDE-25) 37.5-25 MG tablet Take 2 tablets by mouth every morning.  3   Facility-Administered Medications Prior to Visit  Medication Dose Route Frequency Provider Last Rate Last  Admin   0.9 %  sodium chloride infusion  500 mL Intravenous Continuous Mansouraty, Telford Nab., MD        Allergies  Allergen Reactions   Diclofenac     Flushing     ROS Review of Systems Review of Systems:  A fourteen system review of systems was performed and found to be positive as per HPI.   Objective:    Physical Exam General:  Pleasant and cooperative, in no acute distress.  Neuro:  Alert and oriented,  extra-ocular muscles intact  HEENT:  Normocephalic, atraumatic, neck supple, no carotid bruits appreciated  Skin:  no gross rash, warm, pink. Cardiac:  RRR, S1 S2 Respiratory:  ECTA B/L,  Not using accessory muscles, speaking in full sentences- unlabored. Vascular:  Ext warm, no cyanosis apprec.; cap RF less 2 sec. Psych:  No HI/SI, judgement and insight good, Euthymic mood. Full Affect.  BP 117/77   Pulse 68   Temp 98.5 F (36.9 C)   Ht 5' 5"  (1.651 m)   Wt 242 lb 8 oz (110 kg)   SpO2 97%   BMI 40.35 kg/m  Wt Readings from Last 3 Encounters:  02/23/21 242 lb 8 oz (110 kg)  01/20/21 244 lb 6.4 oz (110.9 kg)  12/09/20 249 lb 4.8 oz (113.1 kg)     Health Maintenance Due  Topic Date Due   PNEUMOCOCCAL POLYSACCHARIDE VACCINE AGE 75-64 HIGH RISK  Never done   OPHTHALMOLOGY EXAM  Never done   URINE MICROALBUMIN  Never done   HIV Screening  Never done   Hepatitis C Screening  Never done   FOOT EXAM  07/08/2020   COVID-19 Vaccine (3 - Booster for Pfizer series) 12/04/2020   Zoster Vaccines- Shingrix (1 of 2) Never done    There are no preventive care reminders to display for this patient.  Lab Results  Component Value Date   TSH 2.500 12/02/2020   Lab Results  Component Value Date   WBC 7.0 12/02/2020   HGB 12.4 12/02/2020   HCT 39.3 12/02/2020   MCV 81 12/02/2020   PLT 304 12/02/2020   Lab Results  Component Value Date   NA 138 02/23/2021   K 3.7 02/23/2021   CO2 24 02/23/2021   GLUCOSE 104 (H) 02/23/2021   BUN 20 02/23/2021   CREATININE 1.01  (H) 02/23/2021   BILITOT 0.4 02/23/2021   ALKPHOS 82 02/23/2021   AST 17 02/23/2021   ALT  24 02/23/2021   PROT 7.4 02/23/2021   ALBUMIN 4.8 02/23/2021   CALCIUM 9.9 02/23/2021   EGFR 68 02/23/2021   Lab Results  Component Value Date   CHOL 205 (H) 12/02/2020   Lab Results  Component Value Date   HDL 53 12/02/2020   Lab Results  Component Value Date   LDLCALC 131 (H) 12/02/2020   Lab Results  Component Value Date   TRIG 120 12/02/2020   Lab Results  Component Value Date   CHOLHDL 3.9 12/02/2020   Lab Results  Component Value Date   HGBA1C 5.6 12/02/2020      Assessment & Plan:   Problem List Items Addressed This Visit       Cardiovascular and Mediastinum   Hypertension - Primary   Relevant Orders   Comp Met (CMET) (Completed)   Hypertension: -BP at goal. Possibly steroid contributing to elevated BP episode. Recommend to continue current medication regimen (Maxzide 75-50 mg). Advised to let me know if starts experiencing soft blood pressure readings consistently <110/60 then will consider adjusting dose and decrease Maxzide. -Continue low sodium diet and stay well hydrated. -Will collect CMP for medication monitoring. -Will continue to monitor.     No orders of the defined types were placed in this encounter.   Follow-up: Return in about 3 months (around 05/26/2021) for HTN.   Note:  This note was prepared with assistance of Dragon voice recognition software. Occasional wrong-word or sound-a-like substitutions may have occurred due to the inherent limitations of voice recognition software.   Lorrene Reid, PA-C

## 2021-02-24 LAB — COMPREHENSIVE METABOLIC PANEL
ALT: 24 IU/L (ref 0–32)
AST: 17 IU/L (ref 0–40)
Albumin/Globulin Ratio: 1.8 (ref 1.2–2.2)
Albumin: 4.8 g/dL (ref 3.8–4.8)
Alkaline Phosphatase: 82 IU/L (ref 44–121)
BUN/Creatinine Ratio: 20 (ref 9–23)
BUN: 20 mg/dL (ref 6–24)
Bilirubin Total: 0.4 mg/dL (ref 0.0–1.2)
CO2: 24 mmol/L (ref 20–29)
Calcium: 9.9 mg/dL (ref 8.7–10.2)
Chloride: 96 mmol/L (ref 96–106)
Creatinine, Ser: 1.01 mg/dL — ABNORMAL HIGH (ref 0.57–1.00)
Globulin, Total: 2.6 g/dL (ref 1.5–4.5)
Glucose: 104 mg/dL — ABNORMAL HIGH (ref 65–99)
Potassium: 3.7 mmol/L (ref 3.5–5.2)
Sodium: 138 mmol/L (ref 134–144)
Total Protein: 7.4 g/dL (ref 6.0–8.5)
eGFR: 68 mL/min/{1.73_m2} (ref >59)

## 2021-03-09 ENCOUNTER — Telehealth: Payer: Self-pay | Admitting: Physician Assistant

## 2021-03-09 DIAGNOSIS — I10 Essential (primary) hypertension: Secondary | ICD-10-CM

## 2021-03-09 MED ORDER — TRIAMTERENE-HCTZ 37.5-25 MG PO TABS: 2.0000 | ORAL_TABLET | Freq: Every morning | ORAL | 0 refills | Status: DC

## 2021-03-09 NOTE — Addendum Note (Signed)
Addended by: Mickel Crow on: 03/09/2021 09:43 AM   Modules accepted: Orders

## 2021-03-09 NOTE — Telephone Encounter (Signed)
Patient would like a refill on Maxzide-25 and uses [facility] on [address], thanks.

## 2021-03-15 ENCOUNTER — Telehealth: Payer: Self-pay | Admitting: Physician Assistant

## 2021-03-15 DIAGNOSIS — G8929 Other chronic pain: Secondary | ICD-10-CM

## 2021-03-15 DIAGNOSIS — R519 Headache, unspecified: Secondary | ICD-10-CM

## 2021-03-15 MED ORDER — RIZATRIPTAN BENZOATE 10 MG PO TABS: 10.0000 mg | ORAL_TABLET | ORAL | 0 refills | Status: DC | PRN

## 2021-03-15 NOTE — Telephone Encounter (Signed)
Since BP medication increase BP has been 133/80 and 117/80 when she is having the headaches but doesn't feel that the headaches are related to her BP. Headache is described as continuous and she is having some jaw pain, ear pain. Headache is worse at night. Starts at the base of her neck but moves all over.

## 2021-03-15 NOTE — Telephone Encounter (Signed)
Spoke with Herb Grays who advised to send in [place] for acute headaches.   Pt is aware and verbalized understanding. AS, CMA

## 2021-04-01 ENCOUNTER — Telehealth: Payer: Self-pay | Admitting: Physician Assistant

## 2021-04-01 NOTE — Telephone Encounter (Signed)
Patient requested a phone call from a nurse about tingling, she did not disclose anything else. Please advise, thanks.

## 2021-04-04 ENCOUNTER — Encounter: Payer: Self-pay | Admitting: Nurse Practitioner

## 2021-04-04 ENCOUNTER — Other Ambulatory Visit: Payer: Self-pay

## 2021-04-04 ENCOUNTER — Ambulatory Visit (INDEPENDENT_AMBULATORY_CARE_PROVIDER_SITE_OTHER): Payer: 59 | Admitting: Nurse Practitioner

## 2021-04-04 VITALS — BP 123/81 | HR 76 | Temp 98.2°F | Ht 65.0 in | Wt 244.8 lb

## 2021-04-04 DIAGNOSIS — G5690 Unspecified mononeuropathy of unspecified upper limb: Secondary | ICD-10-CM

## 2021-04-04 NOTE — Progress Notes (Signed)
Established Patient Office Visit  Subjective:  Patient ID: Chelsea Robbins, female    DOB: [birth year]  Age: 50 y.o. MRN: [id number]  CC:  Chief Complaint  Patient presents with   Tingling    HPI Chelsea Robbins presents for evaluation of tingling and numbness in both hands and arms.  States she is also noticing tingling in the left side of her jaw.  States this started suddenly last week.  She states she also has some sinus pressure.  She denies chest pain, chest tightness, or shortness of breath.  She denies headaches.  She states the tingling and numbness have been arms and both hands at the same time.  Does not affect her ability to use her arms or her hands.  She states that she had similar symptoms in both of her legs and feet 2 years ago.  She states she was diagnosed with neuropathy.  She saw neurologist who did EMG study.  Told her she has neuropathy in a nerve that was not typically associated with type 2 diabetes.  Patient states no treatment was recommended at the time.  Patient has full range of motion of her arms and hands.  Fine motor motions are intact.  She denies facial weakness, Drooping, or slurred  Past Medical History:  Diagnosis Date   Anemia    anemia   Blood transfusion without reported diagnosis    1993 at birth of daughter   Diabetes mellitus without complication ([place])    prediabetes   Diabetic peripheral neuropathy ([place]) 02/19/2019   Elevated hemoglobin A1c    Heart murmur    noted 7-10 yrs   Hypertension    Neck pain    Plantar fasciitis     Past Surgical History:  Procedure Laterality Date   CESAREAN SECTION     FOOT SURGERY     Bilateral   TONSILLECTOMY     TUBAL LIGATION      Family History  Problem Relation Age of Onset   Cancer Mother        GYN   Drug abuse Father    Cancer Father    Cancer Maternal Aunt        breast, lung   Cancer Maternal Grandmother        brain   Cancer Maternal Grandfather        throat   Esophageal cancer  Maternal Grandfather 62   Colon cancer Neg Hx    Colon polyps Neg Hx    Rectal cancer Neg Hx    Stomach cancer Neg Hx     Social History   Socioeconomic History   Marital status: Married    Spouse name: Not on file   Number of children: Not on file   Years of education: Not on file   Highest education level: Not on file  Occupational History   Not on file  Tobacco Use   Smoking status: Never   Smokeless tobacco: Never  Vaping Use   Vaping Use: Never used  Substance and Sexual Activity   Alcohol use: Yes    Comment: Rarely    Drug use: Not Currently   Sexual activity: Yes    Birth control/protection: Surgical  Other Topics Concern   Not on file  Social History Narrative   Right handed    Caffeine 2 -3 cups daily    Lives at home with husband and 1 adult.    3 children   Social Determinants of Radio broadcast assistant  Strain: Not on file  Food Insecurity: Not on file  Transportation Needs: Not on file  Physical Activity: Not on file  Stress: Not on file  Social Connections: Not on file  Intimate Partner Violence: Not on file    Outpatient Medications Prior to Visit  Medication Sig Dispense Refill   Cholecalciferol (VITAMIN D3 PO) Take by mouth. Takes between 2000 and 5000 units.     Ferrous Sulfate (IRON) 325 (65 Fe) MG TABS Take by mouth. 1 tab per week     Pramoxine-HC (HYDROCORTISONE ACE-PRAMOXINE) 2.5-1 % CREA As needed     rizatriptan (MAXALT) 10 MG tablet Take 1 tablet (10 mg total) by mouth as needed for migraine. May repeat in 2 hours if needed 10 tablet 0   triamterene-hydrochlorothiazide (MAXZIDE-25) 37.5-25 MG tablet Take 2 tablets by mouth every morning. 180 tablet 0   amoxicillin-clavulanate (AUGMENTIN) 875-125 MG tablet SMARTSIG:1 Tablet(s) By Mouth Every 12 Hours     montelukast (SINGULAIR) 10 MG tablet Take 10 mg by mouth at bedtime.     predniSONE (DELTASONE) 20 MG tablet Take 20 mg by mouth daily.     Facility-Administered Medications Prior  to Visit  Medication Dose Route Frequency Provider Last Rate Last Admin   0.9 %  sodium chloride infusion  500 mL Intravenous Continuous Mansouraty, Telford Nab., MD        Allergies  Allergen Reactions   Diclofenac     Flushing     ROS Review of Systems  Constitutional:  Negative for activity change, chills, fatigue and fever.  HENT:  Negative for congestion, facial swelling, postnasal drip, rhinorrhea, sinus pressure and sinus pain.   Eyes: Negative.   Respiratory:  Negative for cough, chest tightness, shortness of breath and wheezing.   Cardiovascular:  Negative for chest pain and palpitations.  Gastrointestinal:  Negative for diarrhea, nausea and vomiting.  Endocrine: Negative.   Genitourinary: Negative.   Musculoskeletal:  Negative for back pain and myalgias.  Skin:  Negative for rash.  Allergic/Immunologic: Negative.   Neurological:  Positive for numbness. Negative for dizziness, tremors, weakness and headaches.       The patient is complaining of numbness and tingling of the shoulders arms and hands.  Its affecting both extremities equally.  Sensations have been at the same time.  Patient denies neck pain or neck trauma.  Psychiatric/Behavioral:  Negative for sleep disturbance. The patient is not nervous/anxious.      Objective:    Physical Exam Vitals and nursing note reviewed.  Constitutional:      Appearance: Normal appearance. She is well-developed. She is obese.  HENT:     Head: Normocephalic and atraumatic.     Right Ear: Tympanic membrane and ear canal normal.     Left Ear: Tympanic membrane and ear canal normal.     Nose: Nose normal.     Mouth/Throat:     Mouth: Mucous membranes are moist.  Eyes:     Extraocular Movements: Extraocular movements intact.     Conjunctiva/sclera: Conjunctivae normal.     Pupils: Pupils are equal, round, and reactive to light.  Cardiovascular:     Rate and Rhythm: Normal rate and regular rhythm.     Pulses: Normal pulses.      Heart sounds: Normal heart sounds.  Pulmonary:     Effort: Pulmonary effort is normal.     Breath sounds: Normal breath sounds.  Abdominal:     Palpations: Abdomen is soft.  Musculoskeletal:  General: Normal range of motion.     Cervical back: Normal range of motion and neck supple. No signs of trauma, torticollis, tenderness or crepitus. No pain with movement, spinous process tenderness or muscular tenderness. Normal range of motion.     Comments: The patient has full range of motion of shoulders, elbows, wrists, and joints of the hands and fingers.  Strength is also intact.  Grips are strong and equal.  Lymphadenopathy:     Cervical: No cervical adenopathy.  Skin:    General: Skin is warm and dry.     Capillary Refill: Capillary refill takes less than 2 seconds.  Neurological:     General: No focal deficit present.     Mental Status: She is alert and oriented to person, place, and time.     Cranial Nerves: No cranial nerve deficit.     Sensory: No sensory deficit.     Motor: No weakness.     Coordination: Coordination normal.     Gait: Gait normal.     Deep Tendon Reflexes: Reflexes normal.  Psychiatric:        Mood and Affect: Mood normal.        Behavior: Behavior normal.        Thought Content: Thought content normal.        Judgment: Judgment normal.   Today's Vitals   04/04/21 1520  BP: 123/81  Pulse: 76  Temp: 98.2 F (36.8 C)  SpO2: 95%  Weight: 244 lb 12.8 oz (111 kg)  Height: 5' 5"  (1.651 m)   Body mass index is 40.74 kg/m.   Wt Readings from Last 3 Encounters:  04/04/21 244 lb 12.8 oz (111 kg)  02/23/21 242 lb 8 oz (110 kg)  01/20/21 244 lb 6.4 oz (110.9 kg)     Health Maintenance Due  Topic Date Due   PNEUMOCOCCAL POLYSACCHARIDE VACCINE AGE 70-64 HIGH RISK  Never done   OPHTHALMOLOGY EXAM  Never done   URINE MICROALBUMIN  Never done   HIV Screening  Never done   Hepatitis C Screening  Never done   FOOT EXAM  07/08/2020   COVID-19 Vaccine (3  - Booster for Pfizer series) 12/04/2020   Zoster Vaccines- Shingrix (1 of 2) Never done   INFLUENZA VACCINE  03/28/2021    There are no preventive care reminders to display for this patient.  Lab Results  Component Value Date   TSH 2.500 12/02/2020   Lab Results  Component Value Date   WBC 7.0 12/02/2020   HGB 12.4 12/02/2020   HCT 39.3 12/02/2020   MCV 81 12/02/2020   PLT 304 12/02/2020   Lab Results  Component Value Date   NA 138 02/23/2021   K 3.7 02/23/2021   CO2 24 02/23/2021   GLUCOSE 104 (H) 02/23/2021   BUN 20 02/23/2021   CREATININE 1.01 (H) 02/23/2021   BILITOT 0.4 02/23/2021   ALKPHOS 82 02/23/2021   AST 17 02/23/2021   ALT 24 02/23/2021   PROT 7.4 02/23/2021   ALBUMIN 4.8 02/23/2021   CALCIUM 9.9 02/23/2021   EGFR 68 02/23/2021   Lab Results  Component Value Date   CHOL 205 (H) 12/02/2020   Lab Results  Component Value Date   HDL 53 12/02/2020   Lab Results  Component Value Date   LDLCALC 131 (H) 12/02/2020   Lab Results  Component Value Date   TRIG 120 12/02/2020   Lab Results  Component Value Date   CHOLHDL 3.9 12/02/2020  Lab Results  Component Value Date   HGBA1C 5.6 12/02/2020      Assessment & Plan:  1. Neuropathy of upper extremity, unspecified laterality Suspect neuropathy upper extremities may be coming from arthropathy or degenerative in cervical spine.  Will get x-ray of the neck for further evaluation.  Check patient with results when available.  We will consider referral to neurology or orthopedics based on results of x-ray.  Patient is agreeable to this plan. - DG Cervical Spine Complete; Future   Problem List Items Addressed This Visit   None Visit Diagnoses     Neuropathy of upper extremity, unspecified laterality    -  Primary   Relevant Orders   DG Cervical Spine Complete      This note was dictated using Dragon Voice Recognition Software. Rapid proofreading was performed to expedite the delivery of the  information. Despite proofreading, phonetic errors will occur which are common with this voice recognition software. Please take this into consideration. If there are any concerns, please contact our office.   Follow-up: Return for prn worsening or persistent symptoms, as scheduled.    Ronnell Freshwater, NP

## 2021-04-05 ENCOUNTER — Encounter: Payer: Self-pay | Admitting: Nurse Practitioner

## 2021-04-05 DIAGNOSIS — G5690 Unspecified mononeuropathy of unspecified upper limb: Secondary | ICD-10-CM | POA: Insufficient documentation

## 2021-04-06 ENCOUNTER — Other Ambulatory Visit: Payer: Self-pay

## 2021-04-06 ENCOUNTER — Ambulatory Visit
Admission: RE | Admit: 2021-04-06 | Discharge: 2021-04-06 | Disposition: A | Discharge status: Home / Self Care | Payer: 59 | Source: Ambulatory Visit | Attending: Nurse Practitioner | Admitting: Nurse Practitioner

## 2021-04-06 DIAGNOSIS — G5690 Unspecified mononeuropathy of unspecified upper limb: Secondary | ICD-10-CM

## 2021-04-07 ENCOUNTER — Encounter: Payer: Self-pay | Admitting: Nurse Practitioner

## 2021-04-07 NOTE — Progress Notes (Signed)
Spurring at multiple levels of cervical spine likely causing radiculopathy of the arms and hands. MyChart message sent to patient. Can try gabapentin or we can refer to neurology.

## 2021-05-23 ENCOUNTER — Ambulatory Visit: Payer: 59 | Admitting: Physician Assistant

## 2021-05-23 ENCOUNTER — Other Ambulatory Visit: Payer: Self-pay

## 2021-05-23 ENCOUNTER — Encounter: Payer: Self-pay | Admitting: Physician Assistant

## 2021-05-23 ENCOUNTER — Ambulatory Visit (INDEPENDENT_AMBULATORY_CARE_PROVIDER_SITE_OTHER): Payer: 59 | Admitting: Physician Assistant

## 2021-05-23 VITALS — BP 124/83 | HR 69 | Temp 97.1°F | Ht 65.0 in | Wt 247.0 lb

## 2021-05-23 DIAGNOSIS — Z9889 Other specified postprocedural states: Secondary | ICD-10-CM | POA: Diagnosis not present

## 2021-05-23 DIAGNOSIS — Z5189 Encounter for other specified aftercare: Secondary | ICD-10-CM | POA: Diagnosis not present

## 2021-05-23 NOTE — Progress Notes (Signed)
Acute Office Visit  Subjective:    Patient ID: Genesis Dillion, female    DOB: [birth year], 50 y.o.   MRN: [id number]  Chief Complaint  Patient presents with   Acute Visit    Rash     HPI Patient is in today for c/o skin irritation at left lower back. Patient had two biopsies performed 05/11/2021. States her husband has been applying topical Vaseline and putting a bandaid over the lesions. Changed from waterproof Band-Aid to regular Band-Aid. Denies fever, chills or purulent drainage.   Past Medical History:  Diagnosis Date   Anemia    anemia   Blood transfusion without reported diagnosis    1993 at birth of daughter   Diabetes mellitus without complication ([place])    prediabetes   Diabetic peripheral neuropathy ([place]) 02/19/2019   Elevated hemoglobin A1c    Heart murmur    noted 7-10 yrs   Hypertension    Neck pain    Plantar fasciitis     Past Surgical History:  Procedure Laterality Date   CESAREAN SECTION     FOOT SURGERY     Bilateral   TONSILLECTOMY     TUBAL LIGATION      Family History  Problem Relation Age of Onset   Cancer Mother        GYN   Drug abuse Father    Cancer Father    Cancer Maternal Aunt        breast, lung   Cancer Maternal Grandmother        brain   Cancer Maternal Grandfather        throat   Esophageal cancer Maternal Grandfather 62   Colon cancer Neg Hx    Colon polyps Neg Hx    Rectal cancer Neg Hx    Stomach cancer Neg Hx     Social History   Socioeconomic History   Marital status: Married    Spouse name: Not on file   Number of children: Not on file   Years of education: Not on file   Highest education level: Not on file  Occupational History   Not on file  Tobacco Use   Smoking status: Never   Smokeless tobacco: Never  Vaping Use   Vaping Use: Never used  Substance and Sexual Activity   Alcohol use: Yes    Comment: Rarely    Drug use: Not Currently   Sexual activity: Yes    Birth control/protection: Surgical   Other Topics Concern   Not on file  Social History Narrative   Right handed    Caffeine 2 -3 cups daily    Lives at home with husband and 1 adult.    3 children   Social Determinants of Radio broadcast assistant Strain: Not on file  Food Insecurity: Not on file  Transportation Needs: Not on file  Physical Activity: Not on file  Stress: Not on file  Social Connections: Not on file  Intimate Partner Violence: Not on file    Outpatient Medications Prior to Visit  Medication Sig Dispense Refill   Cholecalciferol (VITAMIN D3 PO) Take by mouth. Takes between 2000 and 5000 units.     Ferrous Sulfate (IRON) 325 (65 Fe) MG TABS Take by mouth. 1 tab per week     Pramoxine-HC (HYDROCORTISONE ACE-PRAMOXINE) 2.5-1 % CREA As needed     rizatriptan (MAXALT) 10 MG tablet Take 1 tablet (10 mg total) by mouth as needed for migraine. May repeat in 2 hours  if needed 10 tablet 0   triamterene-hydrochlorothiazide (MAXZIDE-25) 37.5-25 MG tablet Take 2 tablets by mouth every morning. 180 tablet 0   Facility-Administered Medications Prior to Visit  Medication Dose Route Frequency Provider Last Rate Last Admin   0.9 %  sodium chloride infusion  500 mL Intravenous Continuous Mansouraty, Telford Nab., MD        Allergies  Allergen Reactions   Diclofenac     Flushing     Review of Systems Review of Systems:  A fourteen system review of systems was performed and found to be positive as per HPI.    Objective:    Physical Exam General:  Well Developed, well nourished, appropriate for stated age.  Neuro:  Alert and oriented,  extra-ocular muscles intact  HEENT:  Normocephalic, atraumatic, neck supple, Skin:  approximately two 1 cm circular lesions with healthy granulation without mucopurulent drainage and redness noted outlining bandage placement  Cardiac:  RRR, S1 S2 Respiratory:  ECTA B/L and A/P, Not using accessory muscles, speaking in full sentences- unlabored. Vascular:  Ext warm, no  cyanosis apprec.; cap RF less 2 sec. Psych:  No HI/SI, judgement and insight good, Euthymic mood. Full Affect.  BP 124/83   Pulse 69   Temp (!) 97.1 F (36.2 C)   Ht 5' 5"  (1.651 m)   Wt 247 lb (112 kg)   SpO2 97%   BMI 41.10 kg/m  Wt Readings from Last 3 Encounters:  05/23/21 247 lb (112 kg)  04/04/21 244 lb 12.8 oz (111 kg)  02/23/21 242 lb 8 oz (110 kg)    Health Maintenance Due  Topic Date Due   OPHTHALMOLOGY EXAM  Never done   URINE MICROALBUMIN  Never done   HIV Screening  Never done   Hepatitis C Screening  Never done   FOOT EXAM  07/08/2020   COVID-19 Vaccine (3 - Booster for Pfizer series) 12/04/2020   Zoster Vaccines- Shingrix (1 of 2) Never done   INFLUENZA VACCINE  Never done    There are no preventive care reminders to display for this patient.   Lab Results  Component Value Date   TSH 2.500 12/02/2020   Lab Results  Component Value Date   WBC 7.0 12/02/2020   HGB 12.4 12/02/2020   HCT 39.3 12/02/2020   MCV 81 12/02/2020   PLT 304 12/02/2020   Lab Results  Component Value Date   NA 138 02/23/2021   K 3.7 02/23/2021   CO2 24 02/23/2021   GLUCOSE 104 (H) 02/23/2021   BUN 20 02/23/2021   CREATININE 1.01 (H) 02/23/2021   BILITOT 0.4 02/23/2021   ALKPHOS 82 02/23/2021   AST 17 02/23/2021   ALT 24 02/23/2021   PROT 7.4 02/23/2021   ALBUMIN 4.8 02/23/2021   CALCIUM 9.9 02/23/2021   EGFR 68 02/23/2021   Lab Results  Component Value Date   CHOL 205 (H) 12/02/2020   Lab Results  Component Value Date   HDL 53 12/02/2020   Lab Results  Component Value Date   LDLCALC 131 (H) 12/02/2020   Lab Results  Component Value Date   TRIG 120 12/02/2020   Lab Results  Component Value Date   CHOLHDL 3.9 12/02/2020   Lab Results  Component Value Date   HGBA1C 5.6 12/02/2020       Assessment & Plan:   Problem List Items Addressed This Visit   None Visit Diagnoses     Encounter for wound care    -  Primary  S/P skin biopsy           S/p skin biopsy: -Recommend to use non-adhesive pad/dressing and apply topical antibiotic to help with healing. No systemic s/s present to indicate the necessity of oral antibiotic so will defer at this time.   -Follow up if symptoms fail to improve or worsen.    No orders of the defined types were placed in this encounter.    Lorrene Reid, PA-C

## 2021-06-02 ENCOUNTER — Other Ambulatory Visit: Payer: Self-pay | Admitting: Physician Assistant

## 2021-06-02 DIAGNOSIS — I10 Essential (primary) hypertension: Secondary | ICD-10-CM

## 2021-06-08 ENCOUNTER — Other Ambulatory Visit: Payer: Self-pay

## 2021-06-08 ENCOUNTER — Ambulatory Visit (INDEPENDENT_AMBULATORY_CARE_PROVIDER_SITE_OTHER): Payer: 59 | Admitting: Physician Assistant

## 2021-06-08 ENCOUNTER — Encounter: Payer: Self-pay | Admitting: Physician Assistant

## 2021-06-08 VITALS — BP 138/84 | HR 62 | Temp 98.4°F | Ht 65.0 in | Wt 245.1 lb

## 2021-06-08 DIAGNOSIS — R519 Headache, unspecified: Secondary | ICD-10-CM

## 2021-06-08 DIAGNOSIS — I10 Essential (primary) hypertension: Secondary | ICD-10-CM | POA: Diagnosis not present

## 2021-06-08 DIAGNOSIS — G245 Blepharospasm: Secondary | ICD-10-CM

## 2021-06-08 DIAGNOSIS — G8929 Other chronic pain: Secondary | ICD-10-CM

## 2021-06-08 DIAGNOSIS — G5690 Unspecified mononeuropathy of unspecified upper limb: Secondary | ICD-10-CM | POA: Diagnosis not present

## 2021-06-08 NOTE — Progress Notes (Signed)
Established Patient Office Visit  Subjective:  Patient ID: Chelsea Robbins, female    DOB: 1971/01/22  Age: 50 y.o. MRN: [id number]  CC:  Chief Complaint  Patient presents with   Follow-up   Hypertension    HPI Chelsea Robbins presents for follow up on hypertension. Pt denies chest pain, palpitations, dizziness or lower extremity swelling. Taking medication as directed without side effects. Has not checked BP at home recently, states has felt fine. Patient has c/o of eye twitching x few weeks, none today. 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 Reports headaches are about the same. States headaches are worse before her cycle and usually  last longer than other headaches, takes Tylenol which does provide some relief. Has not tried Maxalt. States her periods have started to be irregular. Reports when putting pressure on her head or laying down on her side and if her position is not right will have sharp posterior pain from her neck that radiates up to her head.   Past Medical History:  Diagnosis Date   Anemia    anemia   Blood transfusion without reported diagnosis    1993 at birth of daughter   Diabetes mellitus without complication ([place])    prediabetes   Diabetic peripheral neuropathy ([place]) 02/19/2019   Elevated hemoglobin A1c    Heart murmur    noted 7-10 yrs   Hypertension    Neck pain    Plantar fasciitis     Past Surgical History:  Procedure Laterality Date   CESAREAN SECTION     FOOT SURGERY     Bilateral   TONSILLECTOMY     TUBAL LIGATION      Family History  Problem Relation Age of Onset   Cancer Mother        GYN   Drug abuse Father    Cancer Father    Cancer Maternal Aunt        breast, lung   Cancer Maternal Grandmother        brain   Cancer Maternal Grandfather        throat   Esophageal cancer Maternal Grandfather 62   Colon cancer Neg Hx    Colon polyps Neg Hx    Rectal cancer Neg Hx    Stomach cancer Neg Hx     Social History    Socioeconomic History   Marital status: Married    Spouse name: Not on file   Number of children: Not on file   Years of education: Not on file   Highest education level: Not on file  Occupational History   Not on file  Tobacco Use   Smoking status: Never   Smokeless tobacco: Never  Vaping Use   Vaping Use: Never used  Substance and Sexual Activity   Alcohol use: Yes    Comment: Rarely    Drug use: Not Currently   Sexual activity: Yes    Birth control/protection: Surgical  Other Topics Concern   Not on file  Social History Narrative   Right handed    Caffeine 2 -3 cups daily    Lives at home with husband and 1 adult.    3 children   Social Determinants of Health   Financial Resource Strain: Not on file  Food Insecurity: Not on file  Transportation Needs: Not on file  Physical Activity: Not on file  Stress: Not on file  Social Connections: Not on file  Intimate Partner Violence: Not on  file    Outpatient Medications Prior to Visit  Medication Sig Dispense Refill   Cholecalciferol (VITAMIN D3 PO) Take by mouth. Takes between 2000 and 5000 units.     Ferrous Sulfate (IRON) 325 (65 Fe) MG TABS Take by mouth. 1 tab per week     Pramoxine-HC (HYDROCORTISONE ACE-PRAMOXINE) 2.5-1 % CREA As needed     rizatriptan (MAXALT) 10 MG tablet Take 1 tablet (10 mg total) by mouth as needed for migraine. May repeat in 2 hours if needed 10 tablet 0   triamterene-hydrochlorothiazide (MAXZIDE-25) 37.5-25 MG tablet TAKE 2 TABLETS BY MOUTH EVERY MORNING 180 tablet 0   Facility-Administered Medications Prior to Visit  Medication Dose Route Frequency Provider Last Rate Last Admin   0.9 %  sodium chloride infusion  500 mL Intravenous Continuous Mansouraty, Telford Nab., MD        Allergies  Allergen Reactions   Diclofenac     Flushing     ROS Review of Systems Review of Systems:  A fourteen system review of systems was performed and found to be positive as per HPI.  Objective:     Physical Exam General:  Pleasant and cooperative, in no acute distress   Neuro:  Alert and oriented,  extra-ocular muscles intact, no focal deficits   HEENT:  Normocephalic, atraumatic, PERRL, normal TM's of both ears, neck supple,  Skin:  no gross rash, warm, pink. Cardiac:  RRR, S1 S2 Respiratory: CTA B/L and A/P, Not using accessory muscles, speaking in full sentences- unlabored. Vascular:  Ext warm, no cyanosis apprec.; cap RF less 2 sec. Psych:  No HI/SI, judgement and insight good, Euthymic mood. Full Affect.  BP 138/84   Pulse 62   Temp 98.4 F (36.9 C)   Ht 5' 5"  (1.651 m)   Wt 245 lb 1.6 oz (111.2 kg)   SpO2 98%   BMI 40.79 kg/m  Wt Readings from Last 3 Encounters:  06/08/21 245 lb 1.6 oz (111.2 kg)  05/23/21 247 lb (112 kg)  04/04/21 244 lb 12.8 oz (111 kg)     Health Maintenance Due  Topic Date Due   OPHTHALMOLOGY EXAM  Never done   URINE MICROALBUMIN  Never done   HIV Screening  Never done   Hepatitis C Screening  Never done   FOOT EXAM  07/08/2020   COVID-19 Vaccine (3 - Booster for Pfizer series) 12/04/2020   Zoster Vaccines- Shingrix (1 of 2) Never done   INFLUENZA VACCINE  Never done   HEMOGLOBIN A1C  06/03/2021    There are no preventive care reminders to display for this patient.  Lab Results  Component Value Date   TSH 2.500 12/02/2020   Lab Results  Component Value Date   WBC 7.0 12/02/2020   HGB 12.4 12/02/2020   HCT 39.3 12/02/2020   MCV 81 12/02/2020   PLT 304 12/02/2020   Lab Results  Component Value Date   NA 138 02/23/2021   K 3.7 02/23/2021   CO2 24 02/23/2021   GLUCOSE 104 (H) 02/23/2021   BUN 20 02/23/2021   CREATININE 1.01 (H) 02/23/2021   BILITOT 0.4 02/23/2021   ALKPHOS 82 02/23/2021   AST 17 02/23/2021   ALT 24 02/23/2021   PROT 7.4 02/23/2021   ALBUMIN 4.8 02/23/2021   CALCIUM 9.9 02/23/2021   EGFR 68 02/23/2021   Lab Results  Component Value Date   CHOL 205 (H) 12/02/2020   Lab Results  Component Value  Date   HDL 53 12/02/2020  Lab Results  Component Value Date   LDLCALC 131 (H) 12/02/2020   Lab Results  Component Value Date   TRIG 120 12/02/2020   Lab Results  Component Value Date   CHOLHDL 3.9 12/02/2020   Lab Results  Component Value Date   HGBA1C 5.6 12/02/2020      Assessment & Plan:   Problem List Items Addressed This Visit       Cardiovascular and Mediastinum   Hypertension - Primary     Nervous and Auditory   Neuropathy of upper extremity   Other Visit Diagnoses     Chronic nonintractable headache, unspecified headache type       Eye twitch          Hypertension: -Stable. -Continue current medication regimen. -Will continue to monitor. Will repeat CMP with CPE.  Chronic non-intractable headache, unspecified headache type: -Discussed with patient likely perimenopausal which is contributing to headache. Continue with Tylenol as needed for pain relief and if headache fails to improve or worsen recommend to trial Maxalt.  -Will continue to monitor.  Neuropathy of upper extremity: -Discussed with patient s/s suggestive of cervical radiculopathy. 04/06/2021 cervical x-ray reveals mild spondylosis with mild endplate spurring at U0-T2 and C6-C7.  -Reports symptoms are better, recommend to continue to monitor. Patient not interested on medication therapy at this time.  Eye twitch: -Discussed with patient various etiologies. No dystonia noted on exam today.  -Recommend to monitor symptoms. If symptoms fail to improve or worsen recommend referral to Neurology. Patient verbalized understanding.   No orders of the defined types were placed in this encounter.   Follow-up: Return in about 2 months (around 08/08/2021) for CPE and FBW.    Lorrene Reid, PA-C

## 2021-06-08 NOTE — Patient Instructions (Signed)

## 2021-08-11 ENCOUNTER — Ambulatory Visit (INDEPENDENT_AMBULATORY_CARE_PROVIDER_SITE_OTHER): Payer: 59 | Admitting: Physician Assistant

## 2021-08-11 ENCOUNTER — Other Ambulatory Visit: Payer: Self-pay

## 2021-08-11 ENCOUNTER — Encounter: Payer: Self-pay | Admitting: Physician Assistant

## 2021-08-11 VITALS — BP 128/68 | HR 72 | Temp 99.0°F | Ht 65.0 in | Wt 254.0 lb

## 2021-08-11 DIAGNOSIS — Z0000 Encounter for general adult medical examination without abnormal findings: Secondary | ICD-10-CM

## 2021-08-11 DIAGNOSIS — E7800 Pure hypercholesterolemia, unspecified: Secondary | ICD-10-CM

## 2021-08-11 DIAGNOSIS — Z13228 Encounter for screening for other metabolic disorders: Secondary | ICD-10-CM

## 2021-08-11 DIAGNOSIS — R7303 Prediabetes: Secondary | ICD-10-CM | POA: Diagnosis not present

## 2021-08-11 DIAGNOSIS — Z1321 Encounter for screening for nutritional disorder: Secondary | ICD-10-CM | POA: Diagnosis not present

## 2021-08-11 DIAGNOSIS — Z130 Encounter for screening for diseases of the blood and blood-forming organs and certain disorders involving the immune mechanism: Secondary | ICD-10-CM

## 2021-08-11 DIAGNOSIS — I10 Essential (primary) hypertension: Secondary | ICD-10-CM

## 2021-08-11 DIAGNOSIS — Z1329 Encounter for screening for other suspected endocrine disorder: Secondary | ICD-10-CM

## 2021-08-11 LAB — POCT GLYCOSYLATED HEMOGLOBIN (HGB A1C): Hemoglobin A1C: 5.3 % (ref 4.0–5.6)

## 2021-08-11 NOTE — Progress Notes (Signed)
Subjective:     Chelsea Robbins is a 50 y.o. female and is here for a comprehensive physical exam. The patient reports no problems.  Social History   Socioeconomic History   Marital status: Married    Spouse name: Not on file   Number of children: Not on file   Years of education: Not on file   Highest education level: Not on file  Occupational History   Not on file  Tobacco Use   Smoking status: Never   Smokeless tobacco: Never  Vaping Use   Vaping Use: Never used  Substance and Sexual Activity   Alcohol use: Yes    Comment: Rarely    Drug use: Not Currently   Sexual activity: Yes    Birth control/protection: Surgical  Other Topics Concern   Not on file  Social History Narrative   Right handed    Caffeine 2 -3 cups daily    Lives at home with husband and 1 adult.    3 children   Social Determinants of Radio broadcast assistant Strain: Not on file  Food Insecurity: Not on file  Transportation Needs: Not on file  Physical Activity: Not on file  Stress: Not on file  Social Connections: Not on file  Intimate Partner Violence: Not on file   Health Maintenance  Topic Date Due   Pneumococcal Vaccine 77-60 Years old (1 - PCV) Never done   OPHTHALMOLOGY EXAM  Never done   URINE MICROALBUMIN  Never done   HIV Screening  Never done   Hepatitis C Screening  Never done   FOOT EXAM  07/08/2020   COVID-19 Vaccine (3 - Booster for Pfizer series) 08/31/2020   Zoster Vaccines- Shingrix (1 of 2) Never done   INFLUENZA VACCINE  Never done   HEMOGLOBIN A1C  06/03/2021   PAP SMEAR-Modifier  09/09/2021   MAMMOGRAM  10/14/2022   TETANUS/TDAP  06/10/2025   COLONOSCOPY (Pts 45-27yrs Insurance coverage will need to be confirmed)  11/06/2030   HPV VACCINES  Aged Out    The following portions of the patient's history were reviewed and updated as appropriate: allergies, current medications, past family history, past medical history, past social history, past surgical history, and  problem list.  Review of Systems Pertinent items noted in HPI and remainder of comprehensive ROS otherwise negative.   Objective:    BP 128/68    Pulse 72    Temp 99 F (37.2 C)    Ht 5\' 5"  (1.651 m)    Wt 254 lb (115.2 kg)    SpO2 98%    BMI 42.27 kg/m  General appearance: alert, cooperative, and no distress Head: Normocephalic, without obvious abnormality, atraumatic Eyes: conjunctivae/corneas clear. PERRL, EOM's intact. Fundi benign. Ears: normal TM's and external ear canals both ears Nose: Nares normal. Septum midline. Mucosa normal. No drainage or sinus tenderness. Throat: lips, mucosa, and tongue normal; teeth and gums normal Neck: no adenopathy, no JVD, supple, symmetrical, trachea midline, and thyroid: normal to inspection and palpation Back: symmetric, no curvature. ROM normal. No CVA tenderness. Lungs: clear to auscultation bilaterally Heart: regular rate and rhythm and S1, S2 normal Abdomen: soft, non-tender; bowel sounds normal; no masses,  no organomegaly Extremities: extremities normal, atraumatic, no cyanosis or edema Pulses: 2+ and symmetric Skin: Skin color, texture, turgor normal. No rashes or lesions Lymph nodes: Cervical adenopathy: normal and Supraclavicular adenopathy: normal Neurologic: Grossly normal    Assessment:    Healthy female exam.     Plan:  -  Will collect fasting labs. -Patient denies prior dx of DM, reviewed previous A1c's and all within prediabetes range. Per chart review, dx with diabetic neuropathy by neurology 2020. A1c today well controlled at 5.3. -Will follow up with OB/GYN 08/2021.  -Deferred immunizations. -Follow  a heart healthy diet low in fat and carbohydrates. Continue to stay as active as possible. -Follow up in 4 months HTN, HLD, prediabetes   See After Visit Summary for Counseling Recommendations

## 2021-08-11 NOTE — Patient Instructions (Signed)

## 2021-08-12 LAB — CBC WITH DIFFERENTIAL/PLATELET
Basophils Absolute: 0.1 10*3/uL (ref 0.0–0.2)
Basos: 1 %
EOS (ABSOLUTE): 0.2 10*3/uL (ref 0.0–0.4)
Eos: 3 %
Hematocrit: 35.5 % (ref 34.0–46.6)
Hemoglobin: 11.3 g/dL (ref 11.1–15.9)
Immature Grans (Abs): 0 10*3/uL (ref 0.0–0.1)
Immature Granulocytes: 0 %
Lymphocytes Absolute: 1.7 10*3/uL (ref 0.7–3.1)
Lymphs: 26 %
MCH: 25.7 pg — ABNORMAL LOW (ref 26.6–33.0)
MCHC: 31.8 g/dL (ref 31.5–35.7)
MCV: 81 fL (ref 79–97)
Monocytes Absolute: 0.5 10*3/uL (ref 0.1–0.9)
Monocytes: 7 %
Neutrophils Absolute: 4 10*3/uL (ref 1.4–7.0)
Neutrophils: 63 %
Platelets: 328 10*3/uL (ref 150–450)
RBC: 4.39 x10E6/uL (ref 3.77–5.28)
RDW: 13.9 % (ref 11.7–15.4)
WBC: 6.4 10*3/uL (ref 3.4–10.8)

## 2021-08-12 LAB — COMPREHENSIVE METABOLIC PANEL
ALT: 19 IU/L (ref 0–32)
AST: 17 IU/L (ref 0–40)
Albumin/Globulin Ratio: 2.2 (ref 1.2–2.2)
Albumin: 4.3 g/dL (ref 3.8–4.8)
Alkaline Phosphatase: 80 IU/L (ref 44–121)
BUN/Creatinine Ratio: 29 — ABNORMAL HIGH (ref 9–23)
BUN: 24 mg/dL (ref 6–24)
Bilirubin Total: <0.2 mg/dL (ref 0.0–1.2)
CO2: 23 mmol/L (ref 20–29)
Calcium: 9.4 mg/dL (ref 8.7–10.2)
Chloride: 106 mmol/L (ref 96–106)
Creatinine, Ser: 0.82 mg/dL (ref 0.57–1.00)
Globulin, Total: 2 g/dL (ref 1.5–4.5)
Glucose: 93 mg/dL (ref 70–99)
Potassium: 4 mmol/L (ref 3.5–5.2)
Sodium: 141 mmol/L (ref 134–144)
Total Protein: 6.3 g/dL (ref 6.0–8.5)
eGFR: 87 mL/min/{1.73_m2} (ref >59)

## 2021-08-12 LAB — TSH: TSH: 1.93 u[IU]/mL (ref 0.450–4.500)

## 2021-08-12 LAB — LIPID PANEL
Chol/HDL Ratio: 3.4 ratio (ref 0.0–4.4)
Cholesterol, Total: 202 mg/dL — ABNORMAL HIGH (ref 100–199)
HDL: 60 mg/dL (ref >39)
LDL Chol Calc (NIH): 124 mg/dL — ABNORMAL HIGH (ref 0–99)
Triglycerides: 102 mg/dL (ref 0–149)
VLDL Cholesterol Cal: 18 mg/dL (ref 5–40)

## 2021-08-12 LAB — VITAMIN D 25 HYDROXY (VIT D DEFICIENCY, FRACTURES): Vit D, 25-Hydroxy: 38.9 ng/mL (ref 30.0–100.0)

## 2021-09-13 ENCOUNTER — Other Ambulatory Visit: Payer: Self-pay

## 2021-09-13 ENCOUNTER — Telehealth: Payer: Self-pay | Admitting: Physician Assistant

## 2021-09-13 DIAGNOSIS — I10 Essential (primary) hypertension: Secondary | ICD-10-CM

## 2021-09-13 MED ORDER — TRIAMTERENE-HCTZ 37.5-25 MG PO TABS: 2.0000 | ORAL_TABLET | Freq: Every morning | ORAL | 1 refills | Status: DC

## 2021-09-13 NOTE — Telephone Encounter (Signed)
Patient is notified.

## 2021-09-13 NOTE — Telephone Encounter (Signed)
Patient called requesting a refill of her bp medication. Please advise. [phone]

## 2021-09-13 NOTE — Telephone Encounter (Signed)
Refill sent. Please let patient know.

## 2021-10-10 ENCOUNTER — Telehealth: Payer: Self-pay | Admitting: Physician Assistant

## 2021-10-10 NOTE — Telephone Encounter (Signed)
Mychart msg sent to patient. AS< CMA

## 2021-10-10 NOTE — Telephone Encounter (Signed)
Patient wants to know what she can take for congestion since she is on bp medication? She just got over the flu but still has some nasal/head congestion. Please advise. [phone]

## 2021-10-25 LAB — HM PAP SMEAR: HM Pap smear: NEGATIVE

## 2021-10-27 ENCOUNTER — Encounter: Payer: Self-pay | Admitting: Physician Assistant

## 2021-11-03 ENCOUNTER — Other Ambulatory Visit: Payer: Self-pay

## 2021-11-03 ENCOUNTER — Ambulatory Visit (INDEPENDENT_AMBULATORY_CARE_PROVIDER_SITE_OTHER): Payer: 59 | Admitting: Nurse Practitioner

## 2021-11-03 ENCOUNTER — Encounter: Payer: Self-pay | Admitting: Nurse Practitioner

## 2021-11-03 VITALS — BP 120/69 | HR 76 | Temp 98.1°F | Ht 65.0 in | Wt 249.1 lb

## 2021-11-03 DIAGNOSIS — M65272 Calcific tendinitis, left ankle and foot: Secondary | ICD-10-CM | POA: Diagnosis not present

## 2021-11-03 DIAGNOSIS — H6690 Otitis media, unspecified, unspecified ear: Secondary | ICD-10-CM

## 2021-11-03 MED ORDER — CIPROFLOXACIN-DEXAMETHASONE 0.3-0.1 % OT SUSP: 4.0000 [drp] | Freq: Two times a day (BID) | OTIC | 0 refills | Status: DC

## 2021-11-03 NOTE — Progress Notes (Signed)
Established patient visit ? ? ?Patient: Chelsea Robbins   DOB: 07-28-1971   51 y.o. Female  MRN: [id number] ?Visit Date: 11/03/2021 ? ?Chief Complaint  ?Patient presents with  ? Foot Pain  ? ?Subjective  ?  ?The patient states that she noted some pain in the arch of the left foot on Sunday night. Has felt a palpable knot. It is tender to the touch. Having to limp, as standing and walking make the pain much worse. Denies injury or trauma to the foot.  ? ?Patient also having full sensation in both of her ears. They are tender. Having decreased hearing. Has been going on since she and her family had the flu last month . ? ?Foot Pain ?The current episode started in the past 7 days. The problem has been waxing and waning. Associated symptoms include arthralgias and joint swelling. Pertinent negatives include no abdominal pain, chest pain, chills, congestion, coughing, fatigue, fever, headaches, myalgias, nausea, rash, sore throat, vomiting or weakness. The symptoms are aggravated by standing and walking. She has tried nothing for the symptoms.   ? ? ?Medications: ?Outpatient Medications Prior to Visit  ?Medication Sig  ? Cholecalciferol (VITAMIN D3 PO) Take by mouth. Takes between 2000 and 5000 units.  ? Ferrous Sulfate (IRON) 325 (65 Fe) MG TABS Take by mouth. 1 tab per week  ? Pramoxine-HC (HYDROCORTISONE ACE-PRAMOXINE) 2.5-1 % CREA As needed  ? rizatriptan (MAXALT) 10 MG tablet Take 1 tablet (10 mg total) by mouth as needed for migraine. May repeat in 2 hours if needed  ? triamterene-hydrochlorothiazide (MAXZIDE-25) 37.5-25 MG tablet Take 2 tablets by mouth every morning.  ? ?Facility-Administered Medications Prior to Visit  ?Medication Dose Route Frequency Provider  ? 0.9 %  sodium chloride infusion  500 mL Intravenous Continuous Mansouraty, Telford Nab., MD  ? ? ?Review of Systems  ?Constitutional:  Negative for activity change, appetite change, chills, fatigue and fever.  ?HENT:  Positive for ear pain. Negative for  congestion, postnasal drip, rhinorrhea, sinus pressure, sinus pain, sneezing and sore throat.   ?     Excess wax. Muffled sound   ?Eyes: Negative.   ?Respiratory:  Negative for cough, chest tightness, shortness of breath and wheezing.   ?Cardiovascular:  Negative for chest pain and palpitations.  ?Gastrointestinal:  Negative for abdominal pain, constipation, diarrhea, nausea and vomiting.  ?Endocrine: Negative for cold intolerance, heat intolerance, polydipsia and polyuria.  ?Genitourinary:  Negative for dyspareunia, dysuria, flank pain, frequency and urgency.  ?Musculoskeletal:  Positive for arthralgias and joint swelling. Negative for back pain and myalgias.  ?     Left foot pain - mostly in the arch of her foot. Has palpable "knot" in the arch which seems to be getting smaller. States that she is having to limp when she walks .  ?Skin:  Negative for rash.  ?Allergic/Immunologic: Negative for environmental allergies.  ?Neurological:  Negative for dizziness, weakness and headaches.  ?Hematological:  Negative for adenopathy.  ?Psychiatric/Behavioral:  The patient is not nervous/anxious.   ? ? Objective  ?  ? ?Today's Vitals  ? 11/03/21 1021  ?BP: 120/69  ?Pulse: 76  ?Temp: 98.1 ?F (36.7 ?C)  ?SpO2: 97%  ?Weight: 249 lb 1.9 oz (113 kg)  ?Height: '5\' 5"'$  (1.651 m)  ? ?Body mass index is 41.46 kg/m?.  ? ?Physical Exam ?Vitals and nursing note reviewed.  ?Constitutional:   ?   Appearance: Normal appearance. She is well-developed.  ?HENT:  ?   Head: Normocephalic and atraumatic.  ?  Right Ear: Hearing, ear canal and external ear normal. Swelling present. Tympanic membrane is erythematous and bulging.  ?   Left Ear: Tympanic membrane is erythematous.  ?   Ears:  ?   Comments: Increased wax with potential impaction of left ear canal.  ?Eyes:  ?   Pupils: Pupils are equal, round, and reactive to light.  ?Cardiovascular:  ?   Rate and Rhythm: Normal rate and regular rhythm.  ?   Pulses:     ?     Dorsalis pedis pulses are 1+  on the left side.  ?     Posterior tibial pulses are 1+ on the left side.  ?   Heart sounds: Normal heart sounds.  ?Pulmonary:  ?   Effort: Pulmonary effort is normal.  ?   Breath sounds: Normal breath sounds.  ?Abdominal:  ?   Palpations: Abdomen is soft.  ?Musculoskeletal:     ?   General: Normal range of motion.  ?   Cervical back: Normal range of motion and neck supple.  ?     Feet: ? ?Feet:  ?   Left foot:  ?   Skin integrity: Skin integrity normal.  ?   Toenail Condition: Left toenails are normal.  ?Lymphadenopathy:  ?   Cervical: No cervical adenopathy.  ?Skin: ?   General: Skin is warm and dry.  ?   Capillary Refill: Capillary refill takes less than 2 seconds.  ?Neurological:  ?   General: No focal deficit present.  ?   Mental Status: She is alert and oriented to person, place, and time.  ?Psychiatric:     ?   Mood and Affect: Mood normal.     ?   Behavior: Behavior normal.     ?   Thought Content: Thought content normal.     ?   Judgment: Judgment normal.  ?  ? ? Assessment & Plan  ?  ? ?1. Calcific tendonitis of left foot ?Advised patient to rest, ice, and elevate the left foot. Take tylenol and/or ibuprofen to relieve pain and tenderness. Will consider x-ray and referral to podiatry if no improvement in next week.  ? ?2. Subacute otitis media, unspecified otitis media type ?Ciprodex ear drops started. Use 4 drops in both ears twice daily.  ?- ciprofloxacin-dexamethasone (CIPRODEX) OTIC suspension; Place 4 drops into both ears 2 (two) times daily.  Dispense: 7.5 mL; Refill: 0  ? ?Return for prn worsening or persistent symptoms.  ?   ? ? ? ?Ronnell Freshwater, NP  ?[facility] ?[phone] (phone) ?[phone] (fax) ? ?[facility] ?

## 2021-11-04 ENCOUNTER — Telehealth: Payer: Self-pay | Admitting: Physician Assistant

## 2021-11-04 ENCOUNTER — Other Ambulatory Visit: Payer: Self-pay | Admitting: Nurse Practitioner

## 2021-11-04 DIAGNOSIS — H6690 Otitis media, unspecified, unspecified ear: Secondary | ICD-10-CM

## 2021-11-04 MED ORDER — OFLOXACIN 0.3 % OT SOLN: 5.0000 [drp] | Freq: Two times a day (BID) | OTIC | 0 refills | Status: DC

## 2021-11-04 NOTE — Telephone Encounter (Signed)
Changed ear drops to oflaxacin ear drops. Use 5 drops in both ears twice daily. Sent to her pharmacy.

## 2021-11-04 NOTE — Telephone Encounter (Signed)
Patient is aware 

## 2021-11-04 NOTE — Progress Notes (Signed)
Changed ear drops to oflaxacin ear drops. Use 5 drops in both ears twice daily. Sent to her pharmacy.  ?

## 2021-11-04 NOTE — Telephone Encounter (Signed)
Patient called and said the ear drops were over $100 and is asking if you can please change it to something cheaper? [phone] ?

## 2021-11-07 LAB — HM MAMMOGRAPHY

## 2021-11-09 ENCOUNTER — Encounter: Payer: Self-pay | Admitting: Physician Assistant

## 2021-12-06 ENCOUNTER — Other Ambulatory Visit: Payer: Self-pay | Admitting: Obstetrics & Gynecology

## 2021-12-06 ENCOUNTER — Other Ambulatory Visit (HOSPITAL_COMMUNITY): Payer: Self-pay | Admitting: Obstetrics & Gynecology

## 2021-12-06 DIAGNOSIS — N939 Abnormal uterine and vaginal bleeding, unspecified: Secondary | ICD-10-CM

## 2021-12-09 ENCOUNTER — Ambulatory Visit (HOSPITAL_COMMUNITY)
Admission: RE | Admit: 2021-12-09 | Discharge: 2021-12-09 | Disposition: A | Discharge status: Home / Self Care | Payer: 59 | Source: Ambulatory Visit | Attending: Obstetrics & Gynecology | Admitting: Obstetrics & Gynecology

## 2021-12-09 DIAGNOSIS — N939 Abnormal uterine and vaginal bleeding, unspecified: Secondary | ICD-10-CM | POA: Diagnosis not present

## 2021-12-15 ENCOUNTER — Ambulatory Visit (INDEPENDENT_AMBULATORY_CARE_PROVIDER_SITE_OTHER): Payer: 59 | Admitting: Physician Assistant

## 2021-12-15 ENCOUNTER — Encounter: Payer: Self-pay | Admitting: Physician Assistant

## 2021-12-15 VITALS — BP 119/73 | HR 82 | Temp 98.1°F | Ht 65.0 in | Wt 254.0 lb

## 2021-12-15 DIAGNOSIS — R7303 Prediabetes: Secondary | ICD-10-CM

## 2021-12-15 DIAGNOSIS — E7800 Pure hypercholesterolemia, unspecified: Secondary | ICD-10-CM | POA: Diagnosis not present

## 2021-12-15 DIAGNOSIS — M542 Cervicalgia: Secondary | ICD-10-CM

## 2021-12-15 DIAGNOSIS — I10 Essential (primary) hypertension: Secondary | ICD-10-CM | POA: Diagnosis not present

## 2021-12-15 NOTE — Assessment & Plan Note (Signed)
-  Controlled. Continue current medication regimen. Will continue to monitor. 

## 2021-12-15 NOTE — Progress Notes (Signed)
?Established patient visit ? ? ?Patient: Chelsea Robbins   DOB: 09/09/1970   51 y.o. Female  MRN: [id number] ?Visit Date: 12/15/2021 ? ?Chief Complaint  ?Patient presents with  ? Follow-up  ?  PreDM  ? Hyperlipidemia  ? Hypertension  ? ?Subjective  ?  ?HPI ?HPI   ? ? Follow-up   ? Additional comments: PreDM ? ?  ?  ?Last edited by Aron Baba, CMA on 12/15/2021  8:55 AM.  ?  ?  ?Patient presents for follow-up on hypertension, hyperlipidemia and prediabetes. Patient has c/o neck pain that comes and goes which has been more frequent lately. States pain is at the base of her neck. Pain sometimes to her lower arms, head or chest. ? ?HTN: Pt denies chest pain, palpitations, dizziness or severe lower extremity swelling. Taking medication as directed without side effects. Checks BP at home and readings range 130s/70-80s.  ? ?HLD: Pt trying to manage with diet. States no changes with her diet. No regular exercise regimen. Sometimes will go for a walk.  ? ?Prediabetes: No increased thirst or urination. No excessive intake of sweets/sugar. ? ?Medications: ?Outpatient Medications Prior to Visit  ?Medication Sig  ? Cholecalciferol (VITAMIN D3 PO) Take by mouth. Takes between 2000 and 5000 units.  ? Ferrous Sulfate (IRON) 325 (65 Fe) MG TABS Take by mouth. 1 tab per week  ? Pramoxine-HC (HYDROCORTISONE ACE-PRAMOXINE) 2.5-1 % CREA As needed  ? progesterone (PROMETRIUM) 200 MG capsule Take 400 mg by mouth at bedtime.  ? triamcinolone cream (KENALOG) 0.5 % triamcinolone acetonide 0.5 % topical cream ? APPLY 1 APPLICATION TO THE AFFECTED AREA TWICE DAILY X 2 WEEKS. DO NOT APPLY TO FACE  ? triamterene-hydrochlorothiazide (MAXZIDE-25) 37.5-25 MG tablet Take 2 tablets by mouth every morning.  ? [DISCONTINUED] ofloxacin (FLOXIN OTIC) 0.3 % OTIC solution Place 5 drops into both ears 2 (two) times daily.  ? [DISCONTINUED] rizatriptan (MAXALT) 10 MG tablet Take 1 tablet (10 mg total) by mouth as needed for migraine. May repeat in 2 hours  if needed  ? ?Facility-Administered Medications Prior to Visit  ?Medication Dose Route Frequency Provider  ? 0.9 %  sodium chloride infusion  500 mL Intravenous Continuous Mansouraty, Telford Nab., MD  ? ? ?Review of Systems ?Review of Systems:  ?A fourteen system review of systems was performed and found to be positive as per HPI. ? ? ? ?  Objective  ?  ?BP 119/73   Pulse 82   Temp 98.1 ?F (36.7 ?C)   Ht '5\' 5"'$  (1.651 m)   Wt 254 lb (115.2 kg)   SpO2 98%   BMI 42.27 kg/m?  ?BP Readings from Last 3 Encounters:  ?12/15/21 119/73  ?11/03/21 120/69  ?08/11/21 128/68  ? ?Wt Readings from Last 3 Encounters:  ?12/15/21 254 lb (115.2 kg)  ?11/03/21 249 lb 1.9 oz (113 kg)  ?08/11/21 254 lb (115.2 kg)  ? ? ?Physical Exam  ?General:  Well Developed, well nourished, appropriate for stated age.  ?Neuro:  Alert and oriented,  extra-ocular muscles intact  ?HEENT:  Normocephalic, atraumatic, neck supple  ?Skin:  no gross rash, warm, pink. ?Cardiac:  RRR, S1 S2 ?Respiratory: CTA B/L  ?Vascular:  Ext warm, no cyanosis apprec.; cap RF less 2 sec. ?Psych:  No HI/SI, judgement and insight good, Euthymic mood. Full Affect. ? ? ?No results found for any visits on 12/15/21. ? Assessment & Plan  ?  ? ? ?Problem List Items Addressed This Visit   ? ?  ?  Cardiovascular and Mediastinum  ? Hypertension - Primary  ?  -Controlled. ?-Continue current medication regimen. ?-Will continue to monitor. ? ?  ?  ?  ? Other  ? Elevated LDL cholesterol level  ?  -Last lipid panel: HDL 60, LDL 124 ?-Recommend to follow a low fat diet and increase physical activity. ?-Will continue to monitor. ? ?  ?  ? Pre-diabetes  ?  -Last A1c stable at 5.3. Discussed low carbohydrate and glucose diet. Will continue to monitor. ? ?  ?  ? ?Other Visit Diagnoses   ? ? Neck pain      ? ?  ? 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 No bony neural foraminal  narrowing. ?-Discussed with patient management options including PT and/or referral to specialists. Provided handout with exercises and also recommend stretches.  ? ?Return in about 4 months (around 04/16/2022) for HTN, HLD, neck pain.  ?   ? ? ? ?Lorrene Reid, PA-C  ?[facility] at [facility] ?[phone] (phone) ?[phone] (fax) ? ?[facility] ?

## 2021-12-15 NOTE — Patient Instructions (Signed)
Neck Exercises Ask your health care provider which exercises are safe for you. Do exercises exactly as told by your health care provider and adjust them as directed. It is normal to feel mild stretching, pulling, tightness, or discomfort as you do these exercises. Stop right away if you feel sudden pain or your pain gets worse. Do not begin these exercises until told by your health care provider. Neck exercises can be important for many reasons. They can improve strength and maintain flexibility in your neck, which will help your upper back and prevent neck pain. Stretching exercises Rotation neck stretching  Sit in a chair or stand up. Place your feet flat on the floor, shoulder-width apart. Slowly turn your head (rotate) to the right until a slight stretch is felt. Turn it all the way to the right so you can look over your right shoulder. Do not tilt or tip your head. Hold this position for 10-30 seconds. Slowly turn your head (rotate) to the left until a slight stretch is felt. Turn it all the way to the left so you can look over your left shoulder. Do not tilt or tip your head. Hold this position for 10-30 seconds. Repeat __________ times. Complete this exercise __________ times a day. Neck retraction  Sit in a sturdy chair or stand up. Look straight ahead. Do not bend your neck. Use your fingers to push your chin backward (retraction). Do not bend your neck for this movement. Continue to face straight ahead. If you are doing the exercise properly, you will feel a slight sensation in your throat and a stretch at the back of your neck. Hold the stretch for 1-2 seconds. Repeat __________ times. Complete this exercise __________ times a day. Strengthening exercises Neck press  Lie on your back on a firm bed or on the floor with a pillow under your head. Use your neck muscles to push your head down on the pillow and straighten your spine. Hold the position as well as you can. Keep your head  facing up (in a neutral position) and your chin tucked. Slowly count to 5 while holding this position. Repeat __________ times. Complete this exercise __________ times a day. Isometrics These are exercises in which you strengthen the muscles in your neck while keeping your neck still (isometrics). Sit in a supportive chair and place your hand on your forehead. Keep your head and face facing straight ahead. Do not flex or extend your neck while doing isometrics. Push forward with your head and neck while pushing back with your hand. Hold for 10 seconds. Do the sequence again, this time putting your hand against the back of your head. Use your head and neck to push backward against the hand pressure. Finally, do the same exercise on either side of your head, pushing sideways against the pressure of your hand. Repeat __________ times. Complete this exercise __________ times a day. Prone head lifts  Lie face-down (prone position), resting on your elbows so that your chest and upper back are raised. Start with your head facing downward, near your chest. Position your chin either on or near your chest. Slowly lift your head upward. Lift until you are looking straight ahead. Then continue lifting your head as far back as you can comfortably stretch. Hold your head up for 5 seconds. Then slowly lower it to your starting position. Repeat __________ times. Complete this exercise __________ times a day. Supine head lifts  Lie on your back (supine position), bending your knees   to point to the ceiling and keeping your feet flat on the floor. Lift your head slowly off the floor, raising your chin toward your chest. Hold for 5 seconds. Repeat __________ times. Complete this exercise __________ times a day. Scapular retraction  Stand with your arms at your sides. Look straight ahead. Slowly pull both shoulders (scapulae) backward and downward (retraction) until you feel a stretch between your shoulder  blades in your upper back. Hold for 10-30 seconds. Relax and repeat. Repeat __________ times. Complete this exercise __________ times a day. Contact a health care provider if: Your neck pain or discomfort gets worse when you do an exercise. Your neck pain or discomfort does not improve within 2 hours after you exercise. If you have any of these problems, stop exercising right away. Do not do the exercises again unless your health care provider says that you can. Get help right away if: You develop sudden, severe neck pain. If this happens, stop exercising right away. Do not do the exercises again unless your health care provider says that you can. This information is not intended to replace advice given to you by your health care provider. Make sure you discuss any questions you have with your health care provider. Document Revised: 02/08/2021 Document Reviewed: 02/08/2021 Elsevier Patient Education  2023 Elsevier Inc.  

## 2021-12-15 NOTE — Assessment & Plan Note (Signed)
-  Last lipid panel: HDL 60, LDL 124 ?-Recommend to follow a low fat diet and increase physical activity. ?-Will continue to monitor. ?

## 2021-12-15 NOTE — Assessment & Plan Note (Signed)
-  Last A1c stable at 5.3. Discussed low carbohydrate and glucose diet. Will continue to monitor. ?

## 2022-01-16 ENCOUNTER — Encounter: Payer: Self-pay | Admitting: Physician Assistant

## 2022-01-30 ENCOUNTER — Ambulatory Visit (INDEPENDENT_AMBULATORY_CARE_PROVIDER_SITE_OTHER): Payer: 59 | Admitting: Physician Assistant

## 2022-01-30 ENCOUNTER — Encounter: Payer: Self-pay | Admitting: Physician Assistant

## 2022-01-30 VITALS — BP 130/71 | HR 68 | Temp 97.7°F | Ht 65.0 in | Wt 251.0 lb

## 2022-01-30 DIAGNOSIS — E7800 Pure hypercholesterolemia, unspecified: Secondary | ICD-10-CM

## 2022-01-30 DIAGNOSIS — Z01818 Encounter for other preprocedural examination: Secondary | ICD-10-CM | POA: Diagnosis not present

## 2022-01-30 DIAGNOSIS — R7303 Prediabetes: Secondary | ICD-10-CM | POA: Diagnosis not present

## 2022-01-30 DIAGNOSIS — I10 Essential (primary) hypertension: Secondary | ICD-10-CM

## 2022-01-30 NOTE — Patient Instructions (Signed)
Hysterectomy Information  A hysterectomy is a surgery to take out the womb (uterus) or the womb and the lowest part of the womb (cervix), which opens into the vagina. The ovaries, the fallopian tubes, or both may also be taken out. After the procedure, a woman will no longer have menstrual periods and will not be able to get pregnant. What are the benefits? This surgery may improve your quality of life by relieving pain and heavy vaginal bleeding. This surgery may also: Treat long-term (chronic) infection in the area between your hip bones (pelvis). Treat conditions that affect the womb. Treat cancer of the womb or cervix. Lower the risk of cancer. It may also be done for transgender men to match their gender identity. What are the risks? Generally, this surgery is safe. But problems can happen, including: Bleeding. Needing donated blood (transfusion). Blood clots. Infection. Damage to nearby parts or organs. Allergic reactions to medicines. Needing to switch from a surgery that requires small cuts (incisions) to a surgery that requires a large cut. What are the different types? These are the three types: The top part of the womb is taken out, but not the cervix. The womb and cervix are taken out. The womb, the cervix, and the tissue that holds the womb in place are taken out. What happens during the procedure? This surgery may be done in one of these ways: A cut is made in the belly (abdomen). The womb is taken out through the opening. A cut is made in the vagina. The womb is taken out through the opening that is made. A device with a camera to help see is put through one of 3 or 4 cuts made in the belly. The womb is taken out through the vagina. A device with a camera to help see is put through one of 3 or 4 cuts made in the belly. The womb is cut into pieces and taken out through the openings or through the vagina. A computer helps control the surgical tools put into 3 or 4 cuts  made in the belly. The womb is cut into small pieces. The pieces are taken out through the openings or through the vagina. Talk with your doctor to see which is the right one for you. The procedures may vary among doctors and hospitals. What happens after the procedure? You will be given pain medicine. You may need to stay in the hospital for 1-2 days. You may need to have a responsible adult stay with you for a few days after you go home. If your ovaries were taken out, you may get hot flashes, have night sweats, and have trouble sleeping. Talk with your doctor about how often you need Pap tests. Keep all follow-up visits. Questions to ask your doctor Is this surgery needed? What other options do I have? What organs need to be taken out? How long will I need to stay in the hospital? How long will it take to get better at home? What symptoms can I expect after the procedure? Summary A hysterectomy is a surgery to take out your womb. This may be done to treat conditions. After this surgery, you will not have periods and you cannot get pregnant. There are different types of this surgery. Talk with your doctor about which is best for you. This surgery is safe, but there are some risks. This information is not intended to replace advice given to you by your health care provider. Make sure you discuss any questions  you have with your health care provider. Document Revised: 06/09/2020 Document Reviewed: 06/09/2020 Elsevier Patient Education  [employer].

## 2022-01-30 NOTE — Progress Notes (Signed)
Established patient visit   Patient: Chelsea Robbins   DOB: May 07, 1971   51 y.o. Female  MRN: [id number] Visit Date: 01/30/2022  Chief Complaint  Patient presents with   Follow-up   Subjective    HPI  Patient presents for surgical clearance. Pt has a hx of fibroids and planning to have a hysterectomy. Patient reports no prior surgery complications or problems with anesthesia. No chest pain, palpitations, shortness of breath, dizziness, or syncope.    Medications: Outpatient Medications Prior to Visit  Medication Sig   Cholecalciferol (VITAMIN D3 PO) Take by mouth. Takes between 2000 and 5000 units.   Ferrous Sulfate (IRON) 325 (65 Fe) MG TABS Take by mouth. 1 tab per week   Pramoxine-HC (HYDROCORTISONE ACE-PRAMOXINE) 2.5-1 % CREA As needed   progesterone (PROMETRIUM) 200 MG capsule Take 400 mg by mouth at bedtime.   triamcinolone cream (KENALOG) 0.5 % triamcinolone acetonide 0.5 % topical cream  APPLY 1 APPLICATION TO THE AFFECTED AREA TWICE DAILY X 2 WEEKS. DO NOT APPLY TO FACE   triamterene-hydrochlorothiazide (MAXZIDE-25) 37.5-25 MG tablet Take 2 tablets by mouth every morning.   Facility-Administered Medications Prior to Visit  Medication Dose Route Frequency Provider   0.9 %  sodium chloride infusion  500 mL Intravenous Continuous Mansouraty, Telford Nab., MD    Review of Systems Review of Systems:  A fourteen system review of systems was performed and found to be positive as per HPI.   Last CBC Lab Results  Component Value Date   WBC 6.4 08/11/2021   HGB 11.3 08/11/2021   HCT 35.5 08/11/2021   MCV 81 08/11/2021   MCH 25.7 (L) 08/11/2021   RDW 13.9 08/11/2021   PLT 328 16/38/4665   Last metabolic panel Lab Results  Component Value Date   GLUCOSE 93 08/11/2021   NA 141 08/11/2021   K 4.0 08/11/2021   CL 106 08/11/2021   CO2 23 08/11/2021   BUN 24 08/11/2021   CREATININE 0.82 08/11/2021   EGFR 87 08/11/2021   CALCIUM 9.4 08/11/2021   PROT 6.3 08/11/2021    ALBUMIN 4.3 08/11/2021   LABGLOB 2.0 08/11/2021   AGRATIO 2.2 08/11/2021   BILITOT <0.2 08/11/2021   ALKPHOS 80 08/11/2021   AST 17 08/11/2021   ALT 19 08/11/2021   Last lipids Lab Results  Component Value Date   CHOL 202 (H) 08/11/2021   HDL 60 08/11/2021   LDLCALC 124 (H) 08/11/2021   TRIG 102 08/11/2021   CHOLHDL 3.4 08/11/2021   Last hemoglobin A1c Lab Results  Component Value Date   HGBA1C 5.3 08/11/2021   Last thyroid functions Lab Results  Component Value Date   TSH 1.930 08/11/2021     Objective    BP 130/71   Pulse 68   Temp 97.7 F (36.5 C)   Ht 5' 5"  (1.651 m)   Wt 251 lb (113.9 kg)   LMP  (LMP Unknown)   SpO2 97%   BMI 41.77 kg/m  BP Readings from Last 3 Encounters:  01/30/22 130/71  12/15/21 119/73  11/03/21 120/69   Wt Readings from Last 3 Encounters:  01/30/22 251 lb (113.9 kg)  12/15/21 254 lb (115.2 kg)  11/03/21 249 lb 1.9 oz (113 kg)    Physical Exam Constitutional:      General: She is not in acute distress.    Appearance: She is not ill-appearing or toxic-appearing.  HENT:     Head: Normocephalic and atraumatic.     Right Ear: External ear normal.  Left Ear: Tympanic membrane and external ear normal.     Ears:     Comments: Partial obstruction of right TM due to excessive cerumen.    Nose: Nose normal. No rhinorrhea.     Mouth/Throat:     Mouth: Mucous membranes are moist.     Pharynx: Oropharynx is clear. No oropharyngeal exudate or posterior oropharyngeal erythema.  Eyes:     Extraocular Movements: Extraocular movements intact.     Conjunctiva/sclera: Conjunctivae normal.     Pupils: Pupils are equal, round, and reactive to light.  Cardiovascular:     Rate and Rhythm: Normal rate and regular rhythm.     Pulses: Normal pulses.     Heart sounds: Normal heart sounds.  Pulmonary:     Effort: Pulmonary effort is normal. No respiratory distress.     Breath sounds: Normal breath sounds. No wheezing or rales.  Abdominal:      General: Bowel sounds are normal. There is no distension.     Tenderness: There is no abdominal tenderness. There is no right CVA tenderness, left CVA tenderness, guarding or rebound.  Musculoskeletal:        General: Normal range of motion.     Cervical back: Normal range of motion and neck supple.  Lymphadenopathy:     Cervical: No cervical adenopathy.  Skin:    General: Skin is warm.     Capillary Refill: Capillary refill takes less than 2 seconds.     Findings: No erythema or rash.  Neurological:     General: No focal deficit present.     Mental Status: She is alert.  Psychiatric:        Mood and Affect: Mood normal.        Behavior: Behavior normal.      No results found for any visits on 01/30/22.  Assessment & Plan      Problem List Items Addressed This Visit       Cardiovascular and Mediastinum   Hypertension   Relevant Orders   Comp Met (CMET)     Other   Elevated LDL cholesterol level   Relevant Orders   Lipid Profile   Pre-diabetes   Relevant Orders   HgB A1c   Other Visit Diagnoses     Pre-operative clearance    -  Primary   Relevant Orders   EKG 12-Lead   CBC w/Diff   Comp Met (CMET)   HgB A1c   Lipid Profile       Preoperative clearance: -EKG obtained: NSR, rate 64 bpm no acute ST-T wave changes and patient currently has no cardiac symptoms. Discussed with patient pending lab results will be medically cleared for surgery. Defer anticoagulant therapy to surgeon.  Hypertension: -Stable. -Continue current medication regimen. -Will collect CMP to monitor renal function and electrolytes. -Will continue to monitor.  Prediabetes: -Last A1c 5.3, will repeat today to ensure controlled.  Elevated LDL cholesterol level: -Last lipid panel: HDL 60, LDL 124 -The 10-year ASCVD risk score (Arnett DK, et al., 2019) is: 3.3% -Will repeat lipid panel today. -Will continue to monitor.   Return if symptoms worsen or fail to improve.         Lorrene Reid, PA-C  [facility] at [facility] [phone] (phone) [phone] (fax)  [place]

## 2022-01-31 LAB — CBC WITH DIFFERENTIAL/PLATELET
Basophils Absolute: 0.1 10*3/uL (ref 0.0–0.2)
Basos: 1 %
EOS (ABSOLUTE): 0.2 10*3/uL (ref 0.0–0.4)
Eos: 3 %
Hematocrit: 36.4 % (ref 34.0–46.6)
Hemoglobin: 11.6 g/dL (ref 11.1–15.9)
Immature Grans (Abs): 0 10*3/uL (ref 0.0–0.1)
Immature Granulocytes: 0 %
Lymphocytes Absolute: 1.6 10*3/uL (ref 0.7–3.1)
Lymphs: 23 %
MCH: 24.3 pg — ABNORMAL LOW (ref 26.6–33.0)
MCHC: 31.9 g/dL (ref 31.5–35.7)
MCV: 76 fL — ABNORMAL LOW (ref 79–97)
Monocytes Absolute: 0.4 10*3/uL (ref 0.1–0.9)
Monocytes: 6 %
Neutrophils Absolute: 4.5 10*3/uL (ref 1.4–7.0)
Neutrophils: 67 %
Platelets: 349 10*3/uL (ref 150–450)
RBC: 4.77 x10E6/uL (ref 3.77–5.28)
RDW: 15.3 % (ref 11.7–15.4)
WBC: 6.9 10*3/uL (ref 3.4–10.8)

## 2022-01-31 LAB — COMPREHENSIVE METABOLIC PANEL
ALT: 21 IU/L (ref 0–32)
AST: 18 IU/L (ref 0–40)
Albumin/Globulin Ratio: 1.8 (ref 1.2–2.2)
Albumin: 4.6 g/dL (ref 3.8–4.9)
Alkaline Phosphatase: 85 IU/L (ref 44–121)
BUN/Creatinine Ratio: 27 — ABNORMAL HIGH (ref 9–23)
BUN: 25 mg/dL — ABNORMAL HIGH (ref 6–24)
Bilirubin Total: 0.3 mg/dL (ref 0.0–1.2)
CO2: 25 mmol/L (ref 20–29)
Calcium: 9.8 mg/dL (ref 8.7–10.2)
Chloride: 104 mmol/L (ref 96–106)
Creatinine, Ser: 0.91 mg/dL (ref 0.57–1.00)
Globulin, Total: 2.5 g/dL (ref 1.5–4.5)
Glucose: 98 mg/dL (ref 70–99)
Potassium: 3.6 mmol/L (ref 3.5–5.2)
Sodium: 143 mmol/L (ref 134–144)
Total Protein: 7.1 g/dL (ref 6.0–8.5)
eGFR: 76 mL/min/{1.73_m2} (ref >59)

## 2022-01-31 LAB — LIPID PANEL
Chol/HDL Ratio: 4.1 ratio (ref 0.0–4.4)
Cholesterol, Total: 199 mg/dL (ref 100–199)
HDL: 48 mg/dL (ref >39)
LDL Chol Calc (NIH): 125 mg/dL — ABNORMAL HIGH (ref 0–99)
Triglycerides: 147 mg/dL (ref 0–149)
VLDL Cholesterol Cal: 26 mg/dL (ref 5–40)

## 2022-01-31 LAB — HEMOGLOBIN A1C
Est. average glucose Bld gHb Est-mCnc: 114 mg/dL
Hgb A1c MFr Bld: 5.6 % (ref 4.8–5.6)

## 2022-03-10 ENCOUNTER — Other Ambulatory Visit: Payer: Self-pay | Admitting: Physician Assistant

## 2022-03-10 DIAGNOSIS — I10 Essential (primary) hypertension: Secondary | ICD-10-CM

## 2022-04-05 NOTE — Progress Notes (Unsigned)
  Established patient visit   Patient: Chelsea Robbins   DOB: 1971-01-21   51 y.o. Female  MRN: [id number] Visit Date: 04/06/2022  No chief complaint on file.  Subjective    HPI  Patient presenting for chronic follow-up visit.  Neck pain:  HTN: Pt denies chest pain, palpitations, dizziness or leg swelling. Taking medication as directed without side effects. Checks BP at home ***times/wk and readings range in ***. Pt follows a low salt diet.  HLD: Pt taking medication as directed without issues. Denies side effects including myalgias and RUQ pain.    Medications: Outpatient Medications Prior to Visit  Medication Sig   Cholecalciferol (VITAMIN D3 PO) Take by mouth. Takes between 2000 and 5000 units.   Ferrous Sulfate (IRON) 325 (65 Fe) MG TABS Take by mouth. 1 tab per week   Pramoxine-HC (HYDROCORTISONE ACE-PRAMOXINE) 2.5-1 % CREA As needed   progesterone (PROMETRIUM) 200 MG capsule Take 400 mg by mouth at bedtime.   triamcinolone cream (KENALOG) 0.5 % triamcinolone acetonide 0.5 % topical cream  APPLY 1 APPLICATION TO THE AFFECTED AREA TWICE DAILY X 2 WEEKS. DO NOT APPLY TO FACE   triamterene-hydrochlorothiazide (MAXZIDE-25) 37.5-25 MG tablet TAKE 2 TABLETS BY MOUTH EVERY MORNING   Facility-Administered Medications Prior to Visit  Medication Dose Route Frequency Provider   0.9 %  sodium chloride infusion  500 mL Intravenous Continuous Mansouraty, Telford Nab., MD    Review of Systems  {Labs  Heme  Chem  Endocrine  Serology  Results Review (optional):[id number]}   Objective    There were no vitals taken for this visit. BP Readings from Last 3 Encounters:  01/30/22 130/71  12/15/21 119/73  11/03/21 120/69   Wt Readings from Last 3 Encounters:  01/30/22 251 lb (113.9 kg)  12/15/21 254 lb (115.2 kg)  11/03/21 249 lb 1.9 oz (113 kg)    Physical Exam  ***  No results found for any visits on 04/06/22.  Assessment & Plan     *** Problem List Items Addressed This Visit        Cardiovascular and Mediastinum   Hypertension - Primary     Other   Elevated LDL cholesterol level    No follow-ups on file.        Lorrene Reid, PA-C  [facility] at [facility] [phone] (phone) [phone] (fax)  [place]

## 2022-04-06 ENCOUNTER — Ambulatory Visit (INDEPENDENT_AMBULATORY_CARE_PROVIDER_SITE_OTHER): Payer: 59 | Admitting: Physician Assistant

## 2022-04-06 ENCOUNTER — Encounter: Payer: Self-pay | Admitting: Physician Assistant

## 2022-04-06 VITALS — BP 120/70 | HR 78 | Temp 97.7°F | Ht 65.0 in | Wt 250.0 lb

## 2022-04-06 DIAGNOSIS — I10 Essential (primary) hypertension: Secondary | ICD-10-CM

## 2022-04-06 DIAGNOSIS — Z1321 Encounter for screening for nutritional disorder: Secondary | ICD-10-CM

## 2022-04-06 DIAGNOSIS — E7800 Pure hypercholesterolemia, unspecified: Secondary | ICD-10-CM | POA: Diagnosis not present

## 2022-04-06 DIAGNOSIS — M47812 Spondylosis without myelopathy or radiculopathy, cervical region: Secondary | ICD-10-CM | POA: Diagnosis not present

## 2022-04-06 DIAGNOSIS — H6121 Impacted cerumen, right ear: Secondary | ICD-10-CM

## 2022-04-06 DIAGNOSIS — H6980 Other specified disorders of Eustachian tube, unspecified ear: Secondary | ICD-10-CM

## 2022-04-06 NOTE — Patient Instructions (Addendum)
Debrox ear drops  Earwax Buildup, Adult The ears produce a substance called earwax that helps keep bacteria out of the ear and protects the skin in the ear canal. Occasionally, earwax can build up in the ear and cause discomfort or hearing loss. What are the causes? This condition is caused by a buildup of earwax. Ear canals are self-cleaning. Ear wax is made in the outer part of the ear canal and generally falls out in small amounts over time. When the self-cleaning mechanism is not working, earwax builds up and can cause decreased hearing and discomfort. Attempting to clean ears with cotton swabs can push the earwax deep into the ear canal and cause decreased hearing and pain. What increases the risk? This condition is more likely to develop in people who: Clean their ears often with cotton swabs. Pick at their ears. Use earplugs or in-ear headphones often, or wear hearing aids. The following factors may also make you more likely to develop this condition: Being female. Being of older age. Naturally producing more earwax. Having narrow ear canals. Having earwax that is overly thick or sticky. Having excess hair in the ear canal. Having eczema. Being dehydrated. What are the signs or symptoms? Symptoms of this condition include: Reduced or muffled hearing. A feeling of fullness in the ear or feeling that the ear is plugged. Fluid coming from the ear. Ear pain or an itchy ear. Ringing in the ear. Coughing. Balance problems. An obvious piece of earwax that can be seen inside the ear canal. How is this diagnosed? This condition may be diagnosed based on: Your symptoms. Your medical history. An ear exam. During the exam, your health care provider will look into your ear with an instrument called an otoscope. You may have tests, including a hearing test. How is this treated? This condition may be treated by: Using ear drops to soften the earwax. Having the earwax removed by a health  care provider. The health care provider may: Flush the ear with water. Use an instrument that has a loop on the end (curette). Use a suction device. Having surgery to remove the wax buildup. This may be done in severe cases. Follow these instructions at home:  Take over-the-counter and prescription medicines only as told by your health care provider. Do not put any objects, including cotton swabs, into your ear. You can clean the opening of your ear canal with a washcloth or facial tissue. Follow instructions from your health care provider about cleaning your ears. Do not overclean your ears. Drink enough fluid to keep your urine pale yellow. This will help to thin the earwax. Keep all follow-up visits as told. If earwax builds up in your ears often or if you use hearing aids, consider seeing your health care provider for routine, preventive ear cleanings. Ask your health care provider how often you should schedule your cleanings. If you have hearing aids, clean them according to instructions from the manufacturer and your health care provider. Contact a health care provider if: You have ear pain. You develop a fever. You have pus or other fluid coming from your ear. You have hearing loss. You have ringing in your ears that does not go away. You feel like the room is spinning (vertigo). Your symptoms do not improve with treatment. Get help right away if: You have bleeding from the affected ear. You have severe ear pain. Summary Earwax can build up in the ear and cause discomfort or hearing loss. The most common symptoms  of this condition include reduced or muffled hearing, a feeling of fullness in the ear, or feeling that the ear is plugged. This condition may be diagnosed based on your symptoms, your medical history, and an ear exam. This condition may be treated by using ear drops to soften the earwax or by having the earwax removed by a health care provider. Do not put any objects,  including cotton swabs, into your ear. You can clean the opening of your ear canal with a washcloth or facial tissue. This information is not intended to replace advice given to you by your health care provider. Make sure you discuss any questions you have with your health care provider. Document Revised: 12/02/2019 Document Reviewed: 12/02/2019 Elsevier Patient Education  [place].  Hypertension, Adult Hypertension is another name for high blood pressure. High blood pressure forces your heart to work harder to pump blood. This can cause problems over time. There are two numbers in a blood pressure reading. There is a top number (systolic) over a bottom number (diastolic). It is best to have a blood pressure that is below 120/80. What are the causes? The cause of this condition is not known. Some other conditions can lead to high blood pressure. What increases the risk? Some lifestyle factors can make you more likely to develop high blood pressure: Smoking. Not getting enough exercise or physical activity. Being overweight. Having too much fat, sugar, calories, or salt (sodium) in your diet. Drinking too much alcohol. Other risk factors include: Having any of these conditions: Heart disease. Diabetes. High cholesterol. Kidney disease. Obstructive sleep apnea. Having a family history of high blood pressure and high cholesterol. Age. The risk increases with age. Stress. What are the signs or symptoms? High blood pressure may not cause symptoms. Very high blood pressure (hypertensive crisis) may cause: Headache. Fast or uneven heartbeats (palpitations). Shortness of breath. Nosebleed. Vomiting or feeling like you may vomit (nauseous). Changes in how you see. Very bad chest pain. Feeling dizzy. Seizures. How is this treated? This condition is treated by making healthy lifestyle changes, such as: Eating healthy foods. Exercising more. Drinking less alcohol. Your doctor  may prescribe medicine if lifestyle changes do not help enough and if: Your top number is above 130. Your bottom number is above 80. Your personal target blood pressure may vary. Follow these instructions at home: Eating and drinking  If told, follow the DASH eating plan. To follow this plan: Fill one half of your plate at each meal with fruits and vegetables. Fill one fourth of your plate at each meal with whole grains. Whole grains include whole-wheat pasta, brown rice, and whole-grain bread. Eat or drink low-fat dairy products, such as skim milk or low-fat yogurt. Fill one fourth of your plate at each meal with low-fat (lean) proteins. Low-fat proteins include fish, chicken without skin, eggs, beans, and tofu. Avoid fatty meat, cured and processed meat, or chicken with skin. Avoid pre-made or processed food. Limit the amount of salt in your diet to less than 1,500 mg each day. Do not drink alcohol if: Your doctor tells you not to drink. You are pregnant, may be pregnant, or are planning to become pregnant. If you drink alcohol: Limit how much you have to: 0-1 drink a day for women. 0-2 drinks a day for men. Know how much alcohol is in your drink. In the [place] one drink equals one 12 oz bottle of beer (355 mL), one 5 oz glass of wine (148 mL),  or one 1 oz glass of hard liquor (44 mL). Lifestyle  Work with your doctor to stay at a healthy weight or to lose weight. Ask your doctor what the best weight is for you. Get at least 30 minutes of exercise that causes your heart to beat faster (aerobic exercise) most days of the week. This may include walking, swimming, or biking. Get at least 30 minutes of exercise that strengthens your muscles (resistance exercise) at least 3 days a week. This may include lifting weights or doing Pilates. Do not smoke or use any products that contain nicotine or tobacco. If you need help quitting, ask your doctor. Check your blood pressure at home as told by  your doctor. Keep all follow-up visits. Medicines Take over-the-counter and prescription medicines only as told by your doctor. Follow directions carefully. Do not skip doses of blood pressure medicine. The medicine does not work as well if you skip doses. Skipping doses also puts you at risk for problems. Ask your doctor about side effects or reactions to medicines that you should watch for. Contact a doctor if: You think you are having a reaction to the medicine you are taking. You have headaches that keep coming back. You feel dizzy. You have swelling in your ankles. You have trouble with your vision. Get help right away if: You get a very bad headache. You start to feel mixed up (confused). You feel weak or numb. You feel faint. You have very bad pain in your: Chest. Belly (abdomen). You vomit more than once. You have trouble breathing. These symptoms may be an emergency. Get help right away. Call 911. Do not wait to see if the symptoms will go away. Do not drive yourself to the hospital. Summary Hypertension is another name for high blood pressure. High blood pressure forces your heart to work harder to pump blood. For most people, a normal blood pressure is less than 120/80. Making healthy choices can help lower blood pressure. If your blood pressure does not get lower with healthy choices, you may need to take medicine. This information is not intended to replace advice given to you by your health care provider. Make sure you discuss any questions you have with your health care provider. Document Revised: 06/02/2021 Document Reviewed: 06/02/2021 Elsevier Patient Education  [place].

## 2022-04-06 NOTE — Assessment & Plan Note (Addendum)
-  Patient working on diet and lifestyle changes. Recommend to follow a heart healthy diet low in fat. -Will repeat lipid panel today.  -The 10-year ASCVD risk score (Arnett DK, et al., 2019) is: 3.6%

## 2022-04-06 NOTE — Assessment & Plan Note (Signed)
-  Controlled. -Continue current medication regimen. -Will collect CMP for medication monitoring. -Will continue to monitor. 

## 2022-04-07 LAB — COMPREHENSIVE METABOLIC PANEL
ALT: 18 IU/L (ref 0–32)
AST: 17 IU/L (ref 0–40)
Albumin/Globulin Ratio: 1.6 (ref 1.2–2.2)
Albumin: 4.2 g/dL (ref 3.8–4.9)
Alkaline Phosphatase: 79 IU/L (ref 44–121)
BUN/Creatinine Ratio: 23 (ref 9–23)
BUN: 18 mg/dL (ref 6–24)
Bilirubin Total: 0.2 mg/dL (ref 0.0–1.2)
CO2: 22 mmol/L (ref 20–29)
Calcium: 9.4 mg/dL (ref 8.7–10.2)
Chloride: 107 mmol/L — ABNORMAL HIGH (ref 96–106)
Creatinine, Ser: 0.79 mg/dL (ref 0.57–1.00)
Globulin, Total: 2.7 g/dL (ref 1.5–4.5)
Glucose: 107 mg/dL — ABNORMAL HIGH (ref 70–99)
Potassium: 3.8 mmol/L (ref 3.5–5.2)
Sodium: 143 mmol/L (ref 134–144)
Total Protein: 6.9 g/dL (ref 6.0–8.5)
eGFR: 91 mL/min/{1.73_m2} (ref >59)

## 2022-04-07 LAB — LIPID PANEL
Chol/HDL Ratio: 3.9 ratio (ref 0.0–4.4)
Cholesterol, Total: 186 mg/dL (ref 100–199)
HDL: 48 mg/dL (ref >39)
LDL Chol Calc (NIH): 115 mg/dL — ABNORMAL HIGH (ref 0–99)
Triglycerides: 126 mg/dL (ref 0–149)
VLDL Cholesterol Cal: 23 mg/dL (ref 5–40)

## 2022-04-07 LAB — CBC WITH DIFFERENTIAL/PLATELET
Basophils Absolute: 0.1 10*3/uL (ref 0.0–0.2)
Basos: 1 %
EOS (ABSOLUTE): 0.3 10*3/uL (ref 0.0–0.4)
Eos: 3 %
Hematocrit: 36.2 % (ref 34.0–46.6)
Hemoglobin: 11.4 g/dL (ref 11.1–15.9)
Immature Grans (Abs): 0 10*3/uL (ref 0.0–0.1)
Immature Granulocytes: 0 %
Lymphocytes Absolute: 1.6 10*3/uL (ref 0.7–3.1)
Lymphs: 21 %
MCH: 24.1 pg — ABNORMAL LOW (ref 26.6–33.0)
MCHC: 31.5 g/dL (ref 31.5–35.7)
MCV: 76 fL — ABNORMAL LOW (ref 79–97)
Monocytes Absolute: 0.5 10*3/uL (ref 0.1–0.9)
Monocytes: 7 %
Neutrophils Absolute: 5.2 10*3/uL (ref 1.4–7.0)
Neutrophils: 68 %
Platelets: 354 10*3/uL (ref 150–450)
RBC: 4.74 x10E6/uL (ref 3.77–5.28)
RDW: 17.7 % — ABNORMAL HIGH (ref 11.7–15.4)
WBC: 7.6 10*3/uL (ref 3.4–10.8)

## 2022-04-07 LAB — VITAMIN D 25 HYDROXY (VIT D DEFICIENCY, FRACTURES): Vit D, 25-Hydroxy: 41.9 ng/mL (ref 30.0–100.0)

## 2022-05-08 ENCOUNTER — Encounter (HOSPITAL_BASED_OUTPATIENT_CLINIC_OR_DEPARTMENT_OTHER): Payer: Self-pay | Admitting: Obstetrics and Gynecology

## 2022-05-08 ENCOUNTER — Other Ambulatory Visit: Payer: Self-pay

## 2022-05-09 ENCOUNTER — Encounter (HOSPITAL_BASED_OUTPATIENT_CLINIC_OR_DEPARTMENT_OTHER): Payer: Self-pay | Admitting: Obstetrics and Gynecology

## 2022-05-09 NOTE — Progress Notes (Addendum)
Spoke w/ via phone for pre-op interview---[facility] needs dos---- urine pregnancy              Lab results------05/19/22 lab appt for cbc, cmp, type & screen, 02/10/22 EKG in chart & Epic COVID test -----patient states asymptomatic no test needed Arrive at -------0815 on Wednesday, 05/24/22 NPO after MN NO Solid Food.  Clear liquids from MN until---0715 Med rec completed Medications to take morning of surgery -----NONE Diabetic medication -----n/a Patient instructed no nail polish to be worn day of surgery Patient instructed to bring photo id and insurance card day of surgery Patient aware to have Driver (ride ) / caregiver    for 24 hours after surgery - husband, Mallie Mussel Patient Special Instructions -----Extended / overnight stay instructions given. Pre-Op special Istructions -----none Patient verbalized understanding of instructions that were given at this phone interview. Patient denies shortness of breath, chest pain, fever, cough at this phone interview.   Patient saw PCP, Lorrene Reid, PA @ [facility] at [facility] on 01/30/22 for pre-operative clearance. Per note, clearance was to be given pending lab results. As of 05/09/22, I do not see where clearance was officially given. I left a message on 05/09/22 with the scheduler at Dr. Donnamarie Poag office letting her know that we need medical clearance. Will follow-up.   Addendum: Dr. Brien Mates called on 05/15/22 and spoke with Jeral Fruit, RN. Per Langley Gauss, Dr. Brien Mates stated that she had looked over the patient's labwork and it was okay for surgery.

## 2022-05-09 NOTE — Progress Notes (Signed)
Your procedure is scheduled on Wednesday, 05/24/22.  Report to [facility].   Call this number if you have problems the morning of surgery  :[phone].   OUR ADDRESS IS [address].  WE ARE LOCATED IN [facility].  PLEASE BRING YOUR INSURANCE CARD AND PHOTO ID DAY OF SURGERY.  ONLY 2 PEOPLE ARE ALLOWED IN  WAITING  ROOM.                                      REMEMBER:  DO NOT EAT FOOD, CANDY GUM OR MINTS  AFTER MIDNIGHT THE NIGHT BEFORE YOUR SURGERY . YOU MAY HAVE CLEAR LIQUIDS FROM MIDNIGHT THE NIGHT BEFORE YOUR SURGERY UNTIL  7:15 AM. NO CLEAR LIQUIDS AFTER   7:15 AM DAY OF SURGERY.  YOU MAY  BRUSH YOUR TEETH MORNING OF SURGERY AND RINSE YOUR MOUTH OUT, NO CHEWING GUM CANDY OR MINTS.     CLEAR LIQUID DIET   Foods Allowed                                                                     Foods Excluded  Coffee and tea, regular and decaf                             liquids that you cannot  Plain Jell-O                                                                   see through such as: Fruit ices (not with fruit pulp)                                     milk, soups, orange juice  Plain  Popsicles                                    All solid food Carbonated beverages, regular and diet                                    Cranberry, grape and apple juices Sports drinks like Gatorade _____________________________________________________________________     TAKE THESE MEDICATIONS MORNING OF SURGERY:  NONE    UP TO 4 VISITORS  MAY VISIT IN THE EXTENDED RECOVERY ROOM UNTIL 800 PM ONLY.  ONE  VISITOR AGE 50 AND OVER MAY SPEND THE NIGHT AND MUST BE IN EXTENDED RECOVERY ROOM NO LATER THAN 800 PM . YOUR DISCHARGE TIME AFTER YOU SPEND THE NIGHT IS 900 AM THE MORNING AFTER YOUR SURGERY.  YOU MAY PACK A SMALL OVERNIGHT BAG WITH TOILETRIES FOR YOUR OVERNIGHT STAY  IF YOU WISH.  YOUR PRESCRIPTION MEDICATIONS WILL BE PROVIDED  DURING [facility].                                      DO NOT WEAR JEWERLY, MAKE UP. DO NOT WEAR LOTIONS, POWDERS, PERFUMES OR NAIL POLISH ON YOUR FINGERNAILS. TOENAIL POLISH IS OK TO WEAR. DO NOT SHAVE FOR 48 HOURS PRIOR TO DAY OF SURGERY. MEN MAY SHAVE FACE AND NECK. CONTACTS, GLASSES, OR DENTURES MAY NOT BE WORN TO SURGERY.  REMEMBER: NO SMOKING, DRUGS OR ALCOHOL FOR 24 HOURS BEFORE YOUR SURGERY.                                    [facility] IS NOT RESPONSIBLE  FOR ANY BELONGINGS.                                                                    Marland Kitchen           [facility] - Preparing for Surgery Before surgery, you can play an important role.  Because skin is not sterile, your skin needs to be as free of germs as possible.  You can reduce the number of germs on your skin by washing with CHG (chlorahexidine gluconate) soap before surgery.  CHG is an antiseptic cleaner which kills germs and bonds with the skin to continue killing germs even after washing. Please DO NOT use if you have an allergy to CHG or antibacterial soaps.  If your skin becomes reddened/irritated stop using the CHG and inform your nurse when you arrive at Short Stay. Do not shave (including legs and underarms) for at least 48 hours prior to the first CHG shower.  You may shave your face/neck. Please follow these instructions carefully:  1.  Shower with CHG Soap the night before surgery and the  morning of Surgery.  2.  If you choose to wash your hair, wash your hair first as usual with your  normal  shampoo.  3.  After you shampoo, rinse your hair and body thoroughly to remove the  shampoo.                            4.  Use CHG as you would any other liquid soap.  You can apply chg directly  to the skin and wash , please wash your belly button thoroughly with chg soap provided night before and morning of your surgery.                     Gently with a scrungie or clean washcloth.  5.  Apply the CHG Soap to your  body ONLY FROM THE NECK DOWN.   Do not use on face/ open                           Wound or open sores. Avoid contact with eyes, ears mouth and genitals (private parts).  Wash face,  Genitals (private parts) with your normal soap.             6.  Wash thoroughly, paying special attention to the area where your surgery  will be performed.  7.  Thoroughly rinse your body with warm water from the neck down.  8.  DO NOT shower/wash with your normal soap after using and rinsing off  the CHG Soap.                9.  Pat yourself dry with a clean towel.            10.  Wear clean pajamas.            11.  Place clean sheets on your bed the night of your first shower and do not  sleep with pets. Day of Surgery : Do not apply any lotions/deodorants the morning of surgery.  Please wear clean clothes to the hospital/surgery center.  IF YOU HAVE ANY SKIN IRRITATION OR PROBLEMS WITH THE SURGICAL SOAP, PLEASE GET A BAR OF GOLD DIAL SOAP AND SHOWER THE NIGHT BEFORE YOUR SURGERY AND THE MORNING OF YOUR SURGERY. PLEASE LET THE NURSE KNOW MORNING OF YOUR SURGERY IF YOU HAD ANY PROBLEMS WITH THE SURGICAL SOAP.   ________________________________________________________________________                                                        QUESTIONS [facility] PRE OP NURSE PHONE [phone].

## 2022-05-16 ENCOUNTER — Ambulatory Visit: Payer: 59 | Admitting: Physician Assistant

## 2022-05-19 ENCOUNTER — Encounter (HOSPITAL_COMMUNITY)
Admission: RE | Admit: 2022-05-19 | Discharge: 2022-05-19 | Disposition: A | Discharge status: Home / Self Care | Payer: 59 | Source: Ambulatory Visit | Attending: Obstetrics and Gynecology | Admitting: Obstetrics and Gynecology

## 2022-05-19 DIAGNOSIS — Z01812 Encounter for preprocedural laboratory examination: Secondary | ICD-10-CM | POA: Insufficient documentation

## 2022-05-19 DIAGNOSIS — Z01818 Encounter for other preprocedural examination: Secondary | ICD-10-CM

## 2022-05-19 LAB — COMPREHENSIVE METABOLIC PANEL
ALT: 18 U/L (ref 0–44)
AST: 19 U/L (ref 15–41)
Albumin: 3.8 g/dL (ref 3.5–5.0)
Alkaline Phosphatase: 63 U/L (ref 38–126)
Anion gap: 6 (ref 5–15)
BUN: 19 mg/dL (ref 6–20)
CO2: 25 mmol/L (ref 22–32)
Calcium: 9.5 mg/dL (ref 8.9–10.3)
Chloride: 110 mmol/L (ref 98–111)
Creatinine, Ser: 0.79 mg/dL (ref 0.44–1.00)
GFR, Estimated: >60 mL/min (ref >60)
Glucose, Bld: 95 mg/dL (ref 70–99)
Potassium: 4.1 mmol/L (ref 3.5–5.1)
Sodium: 141 mmol/L (ref 135–145)
Total Bilirubin: 0.4 mg/dL (ref 0.3–1.2)
Total Protein: 7.3 g/dL (ref 6.5–8.1)

## 2022-05-19 LAB — CBC WITH DIFFERENTIAL/PLATELET
Abs Immature Granulocytes: 0.04 10*3/uL (ref 0.00–0.07)
Basophils Absolute: 0.1 10*3/uL (ref 0.0–0.1)
Basophils Relative: 1 %
Eosinophils Absolute: 0.2 10*3/uL (ref 0.0–0.5)
Eosinophils Relative: 2 %
HCT: 39.9 % (ref 36.0–46.0)
Hemoglobin: 12.3 g/dL (ref 12.0–15.0)
Immature Granulocytes: 1 %
Lymphocytes Relative: 23 %
Lymphs Abs: 2 10*3/uL (ref 0.7–4.0)
MCH: 24.9 pg — ABNORMAL LOW (ref 26.0–34.0)
MCHC: 30.8 g/dL (ref 30.0–36.0)
MCV: 80.9 fL (ref 80.0–100.0)
Monocytes Absolute: 0.5 10*3/uL (ref 0.1–1.0)
Monocytes Relative: 6 %
Neutro Abs: 5.9 10*3/uL (ref 1.7–7.7)
Neutrophils Relative %: 67 %
Platelets: 309 10*3/uL (ref 150–400)
RBC: 4.93 MIL/uL (ref 3.87–5.11)
RDW: 15.9 % — ABNORMAL HIGH (ref 11.5–15.5)
WBC: 8.8 10*3/uL (ref 4.0–10.5)
nRBC: 0 % (ref 0.0–0.2)

## 2022-05-23 NOTE — H&P (Signed)
Chelsea Robbins is an 51 y.o. female P3 with abnormal uterine bleeding from multifibroid uterus.   For past 5 years would have 24-48 hours of heavy periods  Extra heavy periods starting in Feb 2022, at worst soaking ultra tampon or pad in 15 mins   Endometrial biopsies by Dr. Stann Mainland 10/15/20 and 11/10/21 benign Started on progesterone pill since Feb this year, now increased to '400mg'$    12/10/21 US FINDINGS:  Uterus     Measurements: 15.3 x 8.0 x 9.6 cm = volume: 608.9 mL. Uterus is  anteverted. 5.9 x 4.2 x 4.3 cm intramural to submucosal fibroid  positioned at the central uterine body. 4.5 x 4.6 x 4.7 cm  intramural to subserosal fibroid present at the posterior uterine  fundus.     Endometrium  Thickness: 5 mm. No focal abnormality visualized.  Right ovary  Not visualized. No adnexal mass.     Left ovary  Not visualized. No adnexal mass.  H/o cesarean and lap BTL Had blood clot after cesarean, did not get blod thinners in subsequent pregnancies H/o HTN - medical clearance provided by PCP  Menstrual History: Patient's last menstrual period was 04/11/2022 (approximate).    Past Medical History:  Diagnosis Date   Anemia    anemia   Anxiety    recently had a panic attack in 01/2022   Blood transfusion without reported diagnosis    1993 at birth of daughter   Diabetic peripheral neuropathy ([place]) 02/19/2019   numbness and tingling in balls of feet and toes   Elevated hemoglobin A1c 07/03/2019   Hgb A1c 6.3   GERD (gastroesophageal reflux disease)    occasionally   Headache    occasional mild migraines   Heart murmur    noted 7-10 yrs   History of blood clots    blood clot in hip after having a c-section 1993   Hypertension    Follows w/ [facility], LOV w/ Lorrene Reid, PA on 04/06/22.   Neck pain    Plantar fasciitis    Pre-diabetes    hx of, most recent HgbA1c as of 04/2022 was 5.6 on 01/30/22.   Uterine leiomyoma    Wears glasses     Past Surgical History:   Procedure Laterality Date   CESAREAN SECTION  1993   COLONOSCOPY  2022   benign polyps per pt   FOOT SURGERY     Bilateral for plantar fasciitis   TONSILLECTOMY     as a child   TUBAL LIGATION  1999    Family History  Problem Relation Age of Onset   Cancer Mother        GYN   Drug abuse Father    Cancer Father    Cancer Maternal Aunt        breast, lung   Cancer Maternal Grandmother        brain   Cancer Maternal Grandfather        throat   Esophageal cancer Maternal Grandfather 62   Colon cancer Neg Hx    Colon polyps Neg Hx    Rectal cancer Neg Hx    Stomach cancer Neg Hx     Social History:  reports that she has never smoked. She has been exposed to tobacco smoke. She has never used smokeless tobacco. She reports current alcohol use. She reports that she does not currently use drugs.  Allergies:  Allergies  Allergen Reactions   Diclofenac     Flushing  No medications prior to admission.    Review of Systems  Constitutional:  Negative for fever.  HENT:  Positive for sore throat.   Eyes:  Negative for pain.  Respiratory:  Negative for shortness of breath.   Cardiovascular:  Negative for chest pain.  Gastrointestinal:  Negative for abdominal pain.  Genitourinary:  Positive for menstrual problem and vaginal bleeding.  Musculoskeletal:  Negative for joint swelling.  Skin:  Negative for rash.  Neurological:  Negative for headaches.  Psychiatric/Behavioral:  Negative for suicidal ideas.     Weight 113.4 kg, last menstrual period 04/11/2022. Physical Exam Chaperone Chaperone: present  Constitutional General Appearance: healthy-appearing, overweight  Psychiatric Orientation: to time, to place, to person Mood and Affect: active and alert, normal mood, normal affect  Abdomen Auscultation/Inspection/Palpation: normal bowel sounds, soft, non-distended, no tenderness, no hepatomegaly, no splenomegaly, no masses, no CVA tenderness Hernia: none  palpated  Female Genitalia Vulva: no masses, no atrophy, no lesions Bladder/Urethra: normal meatus, no urethral discharge, no urethral mass, bladder non distended Vagina no tenderness, no erythema, no abnormal vaginal discharge, no vesicle(s) or ulcers, no cystocele, no rectocele Cervix: grossly normal, no discharge, no cervical motion tenderness Uterus: midline, mobile, non-tender, no uterine prolapse, contour irregular, fibroids (14 week size) Adnexa/Parametria: no parametrial tenderness, no parametrial mass, no adnexal tenderness, no ovarian mass Pelvic exam limited by habitus No results found for this or any previous visit (from the past 24 hour(s)).  No results found.  Assessment/Plan: 51Y P3 with symptomatic multfibroid uterus - Plan: RA-TLH, bilateral salpingectomy, cysto - Informed consent obtained. Reviewed risk of infection, bleeding, damage to surrounding organs, laparotomy, failure to achieve desired result. All questions answered.  - Ancef 2g   Rowland Lathe 05/23/2022, 5:34 PM

## 2022-05-24 ENCOUNTER — Ambulatory Visit (HOSPITAL_BASED_OUTPATIENT_CLINIC_OR_DEPARTMENT_OTHER): Payer: 59 | Admitting: Certified Registered Nurse Anesthetist

## 2022-05-24 ENCOUNTER — Ambulatory Visit (HOSPITAL_BASED_OUTPATIENT_CLINIC_OR_DEPARTMENT_OTHER)
Admission: RE | Admit: 2022-05-24 | Discharge: 2022-05-25 | Disposition: A | Discharge status: Home / Self Care | Payer: 59 | Attending: Obstetrics and Gynecology | Admitting: Obstetrics and Gynecology

## 2022-05-24 ENCOUNTER — Encounter (HOSPITAL_BASED_OUTPATIENT_CLINIC_OR_DEPARTMENT_OTHER)
Admission: RE | Disposition: A | Discharge status: Home / Self Care | Payer: Self-pay | Source: Home / Self Care | Attending: Obstetrics and Gynecology

## 2022-05-24 ENCOUNTER — Encounter (HOSPITAL_BASED_OUTPATIENT_CLINIC_OR_DEPARTMENT_OTHER): Payer: Self-pay | Admitting: Obstetrics and Gynecology

## 2022-05-24 ENCOUNTER — Other Ambulatory Visit: Payer: Self-pay

## 2022-05-24 DIAGNOSIS — Z90710 Acquired absence of both cervix and uterus: Secondary | ICD-10-CM | POA: Diagnosis present

## 2022-05-24 DIAGNOSIS — Z01818 Encounter for other preprocedural examination: Secondary | ICD-10-CM

## 2022-05-24 DIAGNOSIS — I10 Essential (primary) hypertension: Secondary | ICD-10-CM

## 2022-05-24 DIAGNOSIS — Z7722 Contact with and (suspected) exposure to environmental tobacco smoke (acute) (chronic): Secondary | ICD-10-CM | POA: Diagnosis not present

## 2022-05-24 DIAGNOSIS — Z6841 Body Mass Index (BMI) 40.0 and over, adult: Secondary | ICD-10-CM | POA: Insufficient documentation

## 2022-05-24 DIAGNOSIS — Z79899 Other long term (current) drug therapy: Secondary | ICD-10-CM | POA: Insufficient documentation

## 2022-05-24 DIAGNOSIS — N72 Inflammatory disease of cervix uteri: Secondary | ICD-10-CM | POA: Diagnosis not present

## 2022-05-24 DIAGNOSIS — E119 Type 2 diabetes mellitus without complications: Secondary | ICD-10-CM

## 2022-05-24 DIAGNOSIS — K219 Gastro-esophageal reflux disease without esophagitis: Secondary | ICD-10-CM | POA: Insufficient documentation

## 2022-05-24 DIAGNOSIS — F419 Anxiety disorder, unspecified: Secondary | ICD-10-CM | POA: Diagnosis not present

## 2022-05-24 DIAGNOSIS — D251 Intramural leiomyoma of uterus: Secondary | ICD-10-CM

## 2022-05-24 HISTORY — DX: Prediabetes: R73.03

## 2022-05-24 HISTORY — DX: Headache, unspecified: R51.9

## 2022-05-24 HISTORY — PX: ROBOTIC ASSISTED TOTAL HYSTERECTOMY WITH BILATERAL SALPINGO OOPHERECTOMY: SHX6086

## 2022-05-24 HISTORY — DX: Leiomyoma of uterus, unspecified: D25.9

## 2022-05-24 HISTORY — PX: CYSTOSCOPY: SHX5120

## 2022-05-24 HISTORY — DX: Anxiety disorder, unspecified: F41.9

## 2022-05-24 HISTORY — DX: Personal history of other venous thrombosis and embolism: Z86.718

## 2022-05-24 HISTORY — DX: Presence of spectacles and contact lenses: Z97.3

## 2022-05-24 HISTORY — DX: Gastro-esophageal reflux disease without esophagitis: K21.9

## 2022-05-24 LAB — TYPE AND SCREEN
ABO/RH(D): O POS
Antibody Screen: NEGATIVE

## 2022-05-24 LAB — ABO/RH: ABO/RH(D): O POS

## 2022-05-24 LAB — POCT PREGNANCY, URINE: Preg Test, Ur: NEGATIVE

## 2022-05-24 SURGERY — HYSTERECTOMY, TOTAL, ROBOT-ASSISTED, LAPAROSCOPIC, WITH BILATERAL SALPINGO-OOPHORECTOMY
Anesthesia: General | Site: Bladder

## 2022-05-24 MED ORDER — ACETAMINOPHEN 500 MG PO TABS
ORAL_TABLET | ORAL | Status: AC
  Filled 2022-05-24: qty 2

## 2022-05-24 MED ORDER — KETOROLAC TROMETHAMINE 30 MG/ML IJ SOLN
INTRAMUSCULAR | Status: AC
  Filled 2022-05-24: qty 1

## 2022-05-24 MED ORDER — OXYCODONE HCL 5 MG PO TABS: 5.0000 mg | ORAL_TABLET | ORAL | Status: DC | PRN

## 2022-05-24 MED ORDER — POLYETHYLENE GLYCOL 3350 17 G PO PACK: 17.0000 g | PACK | Freq: Every day | ORAL | Status: DC | PRN

## 2022-05-24 MED ORDER — OXYCODONE HCL 5 MG/5ML PO SOLN: 5.0000 mg | Freq: Once | ORAL | Status: DC | PRN

## 2022-05-24 MED ORDER — ROCURONIUM BROMIDE 10 MG/ML (PF) SYRINGE
PREFILLED_SYRINGE | INTRAVENOUS | Status: AC
  Filled 2022-05-24: qty 10

## 2022-05-24 MED ORDER — HYDROMORPHONE HCL 1 MG/ML IJ SOLN
INTRAMUSCULAR | Status: DC | PRN
  Administered 2022-05-24 (×2): .5 mg via INTRAVENOUS

## 2022-05-24 MED ORDER — HYDROMORPHONE HCL 1 MG/ML IJ SOLN: 0.2500 mg | INTRAMUSCULAR | Status: DC | PRN

## 2022-05-24 MED ORDER — 0.9 % SODIUM CHLORIDE (POUR BTL) OPTIME
TOPICAL | Status: DC | PRN
  Administered 2022-05-24: 500 mL

## 2022-05-24 MED ORDER — CEFAZOLIN SODIUM-DEXTROSE 2-4 GM/100ML-% IV SOLN
INTRAVENOUS | Status: AC
  Filled 2022-05-24: qty 100

## 2022-05-24 MED ORDER — PROMETHAZINE HCL 25 MG/ML IJ SOLN: 6.2500 mg | INTRAMUSCULAR | Status: DC | PRN

## 2022-05-24 MED ORDER — PHENYLEPHRINE HCL-NACL 20-0.9 MG/250ML-% IV SOLN
INTRAVENOUS | Status: DC | PRN
  Administered 2022-05-24: 25 ug/min via INTRAVENOUS

## 2022-05-24 MED ORDER — DEXAMETHASONE SODIUM PHOSPHATE 10 MG/ML IJ SOLN
INTRAMUSCULAR | Status: DC | PRN
  Administered 2022-05-24: 10 mg via INTRAVENOUS

## 2022-05-24 MED ORDER — LACTATED RINGERS IV SOLN: INTRAVENOUS | Status: DC

## 2022-05-24 MED ORDER — PROPOFOL 1000 MG/100ML IV EMUL
INTRAVENOUS | Status: AC
  Filled 2022-05-24: qty 100

## 2022-05-24 MED ORDER — MIDAZOLAM HCL 2 MG/2ML IJ SOLN
INTRAMUSCULAR | Status: AC
  Filled 2022-05-24: qty 2

## 2022-05-24 MED ORDER — PHENYLEPHRINE 80 MCG/ML (10ML) SYRINGE FOR IV PUSH (FOR BLOOD PRESSURE SUPPORT)
PREFILLED_SYRINGE | INTRAVENOUS | Status: DC | PRN
  Administered 2022-05-24 (×2): 80 ug via INTRAVENOUS

## 2022-05-24 MED ORDER — ONDANSETRON HCL 4 MG/2ML IJ SOLN
INTRAMUSCULAR | Status: AC
  Filled 2022-05-24: qty 2

## 2022-05-24 MED ORDER — PROPOFOL 500 MG/50ML IV EMUL
INTRAVENOUS | Status: AC
  Filled 2022-05-24: qty 50

## 2022-05-24 MED ORDER — DOCUSATE SODIUM 100 MG PO CAPS
ORAL_CAPSULE | ORAL | Status: AC
  Filled 2022-05-24: qty 1

## 2022-05-24 MED ORDER — MIDAZOLAM HCL 2 MG/2ML IJ SOLN
INTRAMUSCULAR | Status: DC | PRN
  Administered 2022-05-24: 2 mg via INTRAVENOUS

## 2022-05-24 MED ORDER — LIDOCAINE HCL (PF) 2 % IJ SOLN
INTRAMUSCULAR | Status: AC
  Filled 2022-05-24: qty 10

## 2022-05-24 MED ORDER — HYDROMORPHONE HCL 1 MG/ML IJ SOLN: 0.2000 mg | INTRAMUSCULAR | Status: DC | PRN

## 2022-05-24 MED ORDER — CEFAZOLIN SODIUM-DEXTROSE 2-4 GM/100ML-% IV SOLN
2.0000 g | INTRAVENOUS | Status: AC
  Administered 2022-05-24: 2 g via INTRAVENOUS

## 2022-05-24 MED ORDER — FLUORESCEIN SODIUM 10 % IV SOLN
INTRAVENOUS | Status: DC | PRN
  Administered 2022-05-24: 10 mg via INTRAVENOUS

## 2022-05-24 MED ORDER — OXYCODONE HCL 5 MG PO TABS: 5.0000 mg | ORAL_TABLET | Freq: Once | ORAL | Status: DC | PRN

## 2022-05-24 MED ORDER — ACETAMINOPHEN 500 MG PO TABS
1000.0000 mg | ORAL_TABLET | Freq: Four times a day (QID) | ORAL | Status: DC
  Administered 2022-05-24 – 2022-05-25 (×3): 1000 mg via ORAL

## 2022-05-24 MED ORDER — BISACODYL 10 MG RE SUPP: 10.0000 mg | Freq: Every day | RECTAL | Status: DC | PRN

## 2022-05-24 MED ORDER — EPHEDRINE 5 MG/ML INJ
INTRAVENOUS | Status: AC
  Filled 2022-05-24: qty 5

## 2022-05-24 MED ORDER — ONDANSETRON HCL 4 MG/2ML IJ SOLN
INTRAMUSCULAR | Status: DC | PRN
  Administered 2022-05-24: 4 mg via INTRAVENOUS

## 2022-05-24 MED ORDER — ACETAMINOPHEN 500 MG PO TABS
1000.0000 mg | ORAL_TABLET | ORAL | Status: AC
  Administered 2022-05-24: 1000 mg via ORAL

## 2022-05-24 MED ORDER — ROCURONIUM BROMIDE 10 MG/ML (PF) SYRINGE
PREFILLED_SYRINGE | INTRAVENOUS | Status: DC | PRN
  Administered 2022-05-24: 20 mg via INTRAVENOUS
  Administered 2022-05-24: 100 mg via INTRAVENOUS

## 2022-05-24 MED ORDER — DEXAMETHASONE SODIUM PHOSPHATE 10 MG/ML IJ SOLN
INTRAMUSCULAR | Status: AC
  Filled 2022-05-24: qty 1

## 2022-05-24 MED ORDER — LIDOCAINE 2% (20 MG/ML) 5 ML SYRINGE
INTRAMUSCULAR | Status: DC | PRN
  Administered 2022-05-24: 100 mg via INTRAVENOUS

## 2022-05-24 MED ORDER — FENTANYL CITRATE (PF) 250 MCG/5ML IJ SOLN
INTRAMUSCULAR | Status: DC | PRN
  Administered 2022-05-24 (×5): 50 ug via INTRAVENOUS

## 2022-05-24 MED ORDER — SODIUM CHLORIDE 0.9 % IV SOLN: INTRAVENOUS | Status: DC | PRN

## 2022-05-24 MED ORDER — PROPOFOL 500 MG/50ML IV EMUL
INTRAVENOUS | Status: DC | PRN
  Administered 2022-05-24: 25 ug/kg/min via INTRAVENOUS

## 2022-05-24 MED ORDER — FLEET ENEMA 7-19 GM/118ML RE ENEM: 1.0000 | ENEMA | Freq: Once | RECTAL | Status: DC | PRN

## 2022-05-24 MED ORDER — MEPERIDINE HCL 25 MG/ML IJ SOLN: 6.2500 mg | INTRAMUSCULAR | Status: DC | PRN

## 2022-05-24 MED ORDER — SODIUM CHLORIDE 0.9 % IR SOLN
Status: DC | PRN
  Administered 2022-05-24: 350 mL
  Administered 2022-05-24: 1000 mL

## 2022-05-24 MED ORDER — HYDROMORPHONE HCL 2 MG/ML IJ SOLN
INTRAMUSCULAR | Status: AC
  Filled 2022-05-24: qty 1

## 2022-05-24 MED ORDER — IBUPROFEN 200 MG PO TABS: 600.0000 mg | ORAL_TABLET | Freq: Four times a day (QID) | ORAL | Status: DC

## 2022-05-24 MED ORDER — SIMETHICONE 80 MG PO CHEW: 80.0000 mg | CHEWABLE_TABLET | Freq: Four times a day (QID) | ORAL | Status: DC | PRN

## 2022-05-24 MED ORDER — KETOROLAC TROMETHAMINE 30 MG/ML IJ SOLN
INTRAMUSCULAR | Status: DC | PRN
  Administered 2022-05-24: 30 mg via INTRAVENOUS

## 2022-05-24 MED ORDER — DIPHENHYDRAMINE HCL 50 MG/ML IJ SOLN
INTRAMUSCULAR | Status: DC | PRN
  Administered 2022-05-24: 12.5 mg via INTRAVENOUS

## 2022-05-24 MED ORDER — ONDANSETRON HCL 4 MG/2ML IJ SOLN: 4.0000 mg | Freq: Four times a day (QID) | INTRAMUSCULAR | Status: DC | PRN

## 2022-05-24 MED ORDER — PROPOFOL 10 MG/ML IV BOLUS
INTRAVENOUS | Status: DC | PRN
  Administered 2022-05-24: 200 mg via INTRAVENOUS

## 2022-05-24 MED ORDER — SUGAMMADEX SODIUM 200 MG/2ML IV SOLN
INTRAVENOUS | Status: DC | PRN
  Administered 2022-05-24: 300 mg via INTRAVENOUS

## 2022-05-24 MED ORDER — ONDANSETRON HCL 4 MG PO TABS: 4.0000 mg | ORAL_TABLET | Freq: Four times a day (QID) | ORAL | Status: DC | PRN

## 2022-05-24 MED ORDER — AMISULPRIDE (ANTIEMETIC) 5 MG/2ML IV SOLN: 10.0000 mg | Freq: Once | INTRAVENOUS | Status: DC | PRN

## 2022-05-24 MED ORDER — EPHEDRINE SULFATE-NACL 50-0.9 MG/10ML-% IV SOSY
PREFILLED_SYRINGE | INTRAVENOUS | Status: DC | PRN
  Administered 2022-05-24: 5 mg via INTRAVENOUS

## 2022-05-24 MED ORDER — HEMOSTATIC AGENTS (NO CHARGE) OPTIME
TOPICAL | Status: DC | PRN
  Administered 2022-05-24: 1 via TOPICAL

## 2022-05-24 MED ORDER — FENTANYL CITRATE (PF) 250 MCG/5ML IJ SOLN
INTRAMUSCULAR | Status: AC
  Filled 2022-05-24: qty 5

## 2022-05-24 MED ORDER — KETOROLAC TROMETHAMINE 30 MG/ML IJ SOLN
30.0000 mg | Freq: Four times a day (QID) | INTRAMUSCULAR | Status: DC
  Administered 2022-05-24 – 2022-05-25 (×3): 30 mg via INTRAVENOUS

## 2022-05-24 MED ORDER — LACTATED RINGERS IV SOLN: INTRAVENOUS | Status: DC | PRN

## 2022-05-24 MED ORDER — LIDOCAINE HCL (PF) 2 % IJ SOLN
INTRAMUSCULAR | Status: AC
  Filled 2022-05-24: qty 5

## 2022-05-24 MED ORDER — PROPOFOL 10 MG/ML IV BOLUS
INTRAVENOUS | Status: AC
  Filled 2022-05-24: qty 20

## 2022-05-24 MED ORDER — DOCUSATE SODIUM 100 MG PO CAPS
100.0000 mg | ORAL_CAPSULE | Freq: Two times a day (BID) | ORAL | Status: DC
  Administered 2022-05-24 (×2): 100 mg via ORAL

## 2022-05-24 MED ORDER — ESMOLOL HCL 100 MG/10ML IV SOLN
INTRAVENOUS | Status: AC
  Filled 2022-05-24: qty 10

## 2022-05-24 SURGICAL SUPPLY — 64 items
ADH SKN CLS APL DERMABOND .7 (GAUZE/BANDAGES/DRESSINGS) ×2
APL SRG 38 LTWT LNG FL B (MISCELLANEOUS) ×2
APPLICATOR ARISTA FLEXITIP XL (MISCELLANEOUS) IMPLANT
BARRIER ADHS 3X4 INTERCEED (GAUZE/BANDAGES/DRESSINGS) IMPLANT
BRR ADH 4X3 ABS CNTRL BYND (GAUZE/BANDAGES/DRESSINGS)
COVER BACK TABLE 60X90IN (DRAPES) ×2 IMPLANT
COVER TIP SHEARS 8 DVNC (MISCELLANEOUS) ×2 IMPLANT
COVER TIP SHEARS 8MM DA VINCI (MISCELLANEOUS) ×2
DEFOGGER SCOPE WARMER CLEARIFY (MISCELLANEOUS) ×2 IMPLANT
DERMABOND ADVANCED .7 DNX12 (GAUZE/BANDAGES/DRESSINGS) ×2 IMPLANT
DRAPE ARM DVNC X/XI (DISPOSABLE) ×8 IMPLANT
DRAPE COLUMN DVNC XI (DISPOSABLE) ×2 IMPLANT
DRAPE DA VINCI XI ARM (DISPOSABLE) ×8
DRAPE DA VINCI XI COLUMN (DISPOSABLE) ×2
DRAPE UTILITY XL STRL (DRAPES) ×2 IMPLANT
DRSG TEGADERM 4X4.75 (GAUZE/BANDAGES/DRESSINGS) IMPLANT
DURAPREP 26ML APPLICATOR (WOUND CARE) ×2 IMPLANT
ELECT REM PT RETURN 9FT ADLT (ELECTROSURGICAL) ×2
ELECTRODE REM PT RTRN 9FT ADLT (ELECTROSURGICAL) ×2 IMPLANT
GAUZE 4X4 16PLY ~~LOC~~+RFID DBL (SPONGE) ×4 IMPLANT
GLOVE BIO SURGEON STRL SZ 6 (GLOVE) ×4 IMPLANT
GLOVE BIO SURGEON STRL SZ7 (GLOVE) ×8 IMPLANT
GLOVE BIOGEL PI IND STRL 6.5 (GLOVE) ×6 IMPLANT
HEMOSTAT ARISTA ABSORB 3G PWDR (HEMOSTASIS) IMPLANT
HOLDER FOLEY CATH W/STRAP (MISCELLANEOUS) IMPLANT
IRRIG SUCT STRYKERFLOW 2 WTIP (MISCELLANEOUS) ×2
IRRIGATION SUCT STRKRFLW 2 WTP (MISCELLANEOUS) ×2 IMPLANT
IV NS 1000ML (IV SOLUTION) ×4
IV NS 1000ML BAXH (IV SOLUTION) IMPLANT
KIT TURNOVER CYSTO (KITS) ×2 IMPLANT
LEGGING LITHOTOMY PAIR STRL (DRAPES) ×2 IMPLANT
MANIPULATOR ADVINCU DEL 2.5 PL (MISCELLANEOUS) IMPLANT
MANIPULATOR ADVINCU DEL 3.0 PL (MISCELLANEOUS) IMPLANT
MANIPULATOR ADVINCU DEL 3.5 PL (MISCELLANEOUS) IMPLANT
MANIPULATOR ADVINCU DEL 4.0 PL (MISCELLANEOUS) IMPLANT
NDL INSUFFLATION 14GA 120MM (NEEDLE) ×2 IMPLANT
NEEDLE INSUFFLATION 14GA 120MM (NEEDLE) ×2 IMPLANT
OBTURATOR OPTICAL STANDARD 8MM (TROCAR) ×2
OBTURATOR OPTICAL STND 8 DVNC (TROCAR) ×2
OBTURATOR OPTICALSTD 8 DVNC (TROCAR) ×2 IMPLANT
PACK ROBOT WH (CUSTOM PROCEDURE TRAY) ×2 IMPLANT
PACK ROBOTIC GOWN (GOWN DISPOSABLE) ×2 IMPLANT
PACK TRENDGUARD 450 HYBRID PRO (MISCELLANEOUS) IMPLANT
PAD OB MATERNITY 4.3X12.25 (PERSONAL CARE ITEMS) ×2 IMPLANT
PAD PREP 24X48 CUFFED NSTRL (MISCELLANEOUS) ×2 IMPLANT
POUCH LAPAROSCOPIC INSTRUMENT (MISCELLANEOUS) IMPLANT
PROTECTOR NERVE ULNAR (MISCELLANEOUS) ×4 IMPLANT
SEAL CANN UNIV 5-8 DVNC XI (MISCELLANEOUS) ×6 IMPLANT
SEAL XI 5MM-8MM UNIVERSAL (MISCELLANEOUS) ×6
SET IRRIG Y TYPE TUR BLADDER L (SET/KITS/TRAYS/PACK) ×2 IMPLANT
SET TRI-LUMEN FLTR TB AIRSEAL (TUBING) ×2 IMPLANT
SPIKE FLUID TRANSFER (MISCELLANEOUS) ×2 IMPLANT
SPONGE T-LAP 4X18 ~~LOC~~+RFID (SPONGE) ×2 IMPLANT
SUT VIC AB 0 CT1 36 (SUTURE) ×2 IMPLANT
SUT VICRYL RAPIDE 3 0 (SUTURE) ×4 IMPLANT
SUT VLOC 180 0 9IN  GS21 (SUTURE) ×2
SUT VLOC 180 0 9IN GS21 (SUTURE) ×2 IMPLANT
TOWEL OR 17X26 10 PK STRL BLUE (TOWEL DISPOSABLE) ×2 IMPLANT
TRAY FOLEY W/BAG SLVR 14FR LF (SET/KITS/TRAYS/PACK) ×2 IMPLANT
TRENDGUARD 450 HYBRID PRO PACK (MISCELLANEOUS)
TROCAR PORT AIRSEAL 5X120 (TROCAR) ×2 IMPLANT
TROCAR Z-THREAD FIOS 5X100MM (TROCAR) IMPLANT
WARMER LAPAROSCOPE (MISCELLANEOUS) IMPLANT
WATER STERILE IRR 1000ML POUR (IV SOLUTION) ×2 IMPLANT

## 2022-05-24 NOTE — Transfer of Care (Signed)
Immediate Anesthesia Transfer of Care Note  Patient: Chelsea Robbins  Procedure(s) Performed: XI ROBOTIC ASSISTED TOTAL LAPAROSCOPIC HYSTERECTOMY WITH BILATERAL SALPINGO OOPHORECTOMY (Abdomen) CYSTOSCOPY (Bladder)  Patient Location: PACU  Anesthesia Type:General  Level of Consciousness: drowsy  Airway & Oxygen Therapy: Patient Spontanous Breathing  Post-op Assessment: Report given to RN and Post -op Vital signs reviewed and stable  Post vital signs: Reviewed and stable  Last Vitals:  Vitals Value Taken Time  BP 164/88 05/24/22 1346  Temp    Pulse 80 05/24/22 1350  Resp 13 05/24/22 1350  SpO2 98 % 05/24/22 1350  Vitals shown include unvalidated device data.  Last Pain:  Vitals:   05/24/22 0846  TempSrc: Oral  PainSc: 0-No pain      Patients Stated Pain Goal: 7 (27/61/47 0929)  Complications: No notable events documented.

## 2022-05-24 NOTE — Anesthesia Postprocedure Evaluation (Signed)
Anesthesia Post Note  Patient: Chelsea Robbins  Procedure(s) Performed: XI ROBOTIC ASSISTED TOTAL LAPAROSCOPIC HYSTERECTOMY WITH BILATERAL SALPINGO OOPHORECTOMY (Abdomen) CYSTOSCOPY (Bladder)     Patient location during evaluation: PACU Anesthesia Type: General Level of consciousness: awake and alert Pain management: pain level controlled Vital Signs Assessment: post-procedure vital signs reviewed and stable Respiratory status: spontaneous breathing, nonlabored ventilation and respiratory function stable Cardiovascular status: blood pressure returned to baseline and stable Postop Assessment: no apparent nausea or vomiting Anesthetic complications: no   No notable events documented.  Last Vitals:  Vitals:   05/24/22 1430 05/24/22 1445  BP: 134/70 (!) 143/74  Pulse: 63 (!) 58  Resp: 12 16  Temp:  36.7 C  SpO2: 100% 98%    Last Pain:  Vitals:   05/24/22 1445  TempSrc:   PainSc: 0-No pain                 Lynda Rainwater

## 2022-05-24 NOTE — Anesthesia Procedure Notes (Signed)
Procedure Name: Intubation Date/Time: 05/24/2022 10:30 AM  Performed by: Clearnce Sorrel, CRNAPre-anesthesia Checklist: Patient identified, Emergency Drugs available, Suction available and Patient being monitored Patient Re-evaluated:Patient Re-evaluated prior to induction Oxygen Delivery Method: Circle System Utilized Preoxygenation: Pre-oxygenation with 100% oxygen Induction Type: IV induction Ventilation: Mask ventilation without difficulty Laryngoscope Size: Mac and 3 Grade View: Grade II Tube type: Oral Number of attempts: 1 Airway Equipment and Method: Stylet and Oral airway Placement Confirmation: ETT inserted through vocal cords under direct vision, positive ETCO2 and breath sounds checked- equal and bilateral Secured at: 22 cm Tube secured with: Tape Dental Injury: Teeth and Oropharynx as per pre-operative assessment

## 2022-05-24 NOTE — Progress Notes (Signed)
Pt voided 400  cc and passed a blood clot the size of a quarter.  Will continue to monitor bleeding

## 2022-05-24 NOTE — Anesthesia Preprocedure Evaluation (Signed)
Anesthesia Evaluation  Patient identified by MRN, date of birth, ID band Patient awake    Reviewed: Allergy & Precautions, NPO status , Patient's Chart, lab work & pertinent test results  Airway Mallampati: II  TM Distance: >3 FB Neck ROM: Full    Dental no notable dental hx.    Pulmonary neg pulmonary ROS,    Pulmonary exam normal breath sounds clear to auscultation       Cardiovascular hypertension, Pt. on medications negative cardio ROS Normal cardiovascular exam Rhythm:Regular Rate:Normal     Neuro/Psych  Headaches, Anxiety negative psych ROS   GI/Hepatic Neg liver ROS, GERD  ,  Endo/Other  diabetesMorbid obesity  Renal/GU negative Renal ROS  negative genitourinary   Musculoskeletal negative musculoskeletal ROS (+)   Abdominal (+) + obese,   Peds negative pediatric ROS (+)  Hematology negative hematology ROS (+)   Anesthesia Other Findings   Reproductive/Obstetrics negative OB ROS                             Anesthesia Physical Anesthesia Plan  ASA: 3  Anesthesia Plan: General   Post-op Pain Management: Dilaudid IV, Tylenol PO (pre-op)* and Toradol IV (intra-op)*   Induction: Intravenous  PONV Risk Score and Plan: 3 and Ondansetron, Dexamethasone, Midazolam and Treatment may vary due to age or medical condition  Airway Management Planned: Oral ETT  Additional Equipment:   Intra-op Plan:   Post-operative Plan: Extubation in OR  Informed Consent: I have reviewed the patients History and Physical, chart, labs and discussed the procedure including the risks, benefits and alternatives for the proposed anesthesia with the patient or authorized representative who has indicated his/her understanding and acceptance.     Dental advisory given  Plan Discussed with: CRNA  Anesthesia Plan Comments:         Anesthesia Quick Evaluation

## 2022-05-24 NOTE — Op Note (Signed)
05/24/2022   1:33 PM   PATIENT:  Chelsea Robbins  51 y.o. female   PRE-OPERATIVE DIAGNOSIS:  uterine leiomyomata   POST-OPERATIVE DIAGNOSIS:  uterine leiomyomata   PROCEDURE:  Procedure(s): XI ROBOTIC ASSISTED TOTAL LAPAROSCOPIC HYSTERECTOMY WITH BILATERAL SALPINGECTOMY CYSTOSCOPY (N/A)   SURGEON:  Surgeon(s) and Role:    * Marcel Sorter, Edwinna Areola, MD - Primary    * Bobbye Charleston, MD - Assisting     ANESTHESIA:   general   EBL:  75 mL    BLOOD ADMINISTERED:none   DRAINS: Urinary Catheter (Foley)    LOCAL MEDICATIONS USED:  OTHER  Ropivacaine intraperitoneal   SPECIMEN:  Source of Specimen:  uterus, cervix, bilateral fallopian tubes   DISPOSITION OF SPECIMEN:  PATHOLOGY   COUNTS:  YES   PLAN OF CARE: Admit for overnight observation   PATIENT DISPOSITION:  PACU - hemodynamically stable.   Delay start of Pharmacological VTE agent (>24hrs) due to surgical blood loss or risk of bleeding: not applicable  FINDINGS:  On bimanual exam, enalrged multifibroid uterus sounded to 10.5 cm On laparoscopic view, enlarged 14 week size uterus with multiple intramural leiomyomas. Adhesions from bladder to anterior uterus noted. Bilateral fallopian tubes status post tubal ligation. Normal appearing ovaries bilaterally with small benign appearing cyst on each. Normal appearing ureters bilaterally.  Cystoscopy demonstrated intact bladder and bilateral ureteral jets.    DESCRIPTION OF PROCEDURE: After consent was verified the patient was taken to the operating room.  After adequate anesthesia was achieved the patient was positioned in the dorsal lithotomy position with legs in stirrups. SCDs were in place and cycling.  Exam as above. The patient was prepped and draped in normal sterile fashion. Ancef 2g was given for infection prophylaxis. A speculum was placed in the vagina and the anterior lip of the cervix was grasped with a tenaculum. A stay suture was placed on the anterior lip of the  cervix. The uterus was sounded to 10.5cm and the cervix dilated with [facility] dilators.  The 3.0 cm delineating ring was assembled and placed in the proper fashion.  The speculum was removed and the bladder was drained with a Foley catheter.   Attention was turned to the abdomen. An 73m incision was made 1 cm above the umbilicus and the Veress needle was inserted through the incision. Correct placement was confirmed by opening pressure of 429mg. The abdomen was insufflated with CO2 gas to a total pressure of 153m. The 8 mm robotic trochar was inserted into the abdominal cavity. The laparoscope was inserted and confirmed no vascular or visceral trauma from entry. The patient was placed in steep Trendelenburg.  Two additional 8mm26mbotic trochars were placed, one on each side of the abdomen. An 5mm 44mseal assistant port was placed on the left side. All trochars were inserted under direct visualization of the camera.  The robot was docked.  The fenestrated bipolar was placed on arm 1 and the monopolar scissors on arm 3 and introduced under direct visualization of the camera.   Survey of the abdomen revealed findings as noted above. Bilateral  ureters were visualized peristalsing. The left mesosalpinx was serially ligated to free the distal portion of the left fallopian tube which was removed. The same was repeated on the right side. The left round ligament was transected.  The vesicouterine peritoneum was opened after dissection adhesions and the bladder was dissected off the lower uterine segment and upper vagina.  The same was repeated on the right side. The left posterior broad  ligament was skeletonized. The same process was repeated on the right side. The left uterine vessels were dessicated with bipolar cautery and transected with monopolar scissors. The right uterine vessels were transected in the same fashion. The cervicovaginal junction was identified with the delineating ring and the monopolar scissors  were used to enter the vagina and incise circumferentially around the cervicovaginal junction. The specimen could not be easily removed vaginally, so the largest anterior intramural fibroid was removed with cautery. The uterus, cervix, bilateral fallopian tubes were removed through the vagina, followed by the fibroid, and all were sent for pathology. The vaginal cuff was closed with V loc suture in a running fashion. The suture was removed. An assistant palpated the vaginal cuff from the vaginal field and confirmed the vaginal cuff was intact without defect. The abdomen was irrigated and suctioned. Arista was applied to the vesicouterine peritoneum which was oozing slightly. The pelvis was observed to be hemostatic at 65mHg and 815mg. The robot was undocked and Ropivicaine was introduced into the pelvis. All ports were removed. Fluorescein was administered.    The port sites were closed with 3-0 vicryl in a subcuticular fashion and covered with skin glue.  The foley catheter was removed. A cystoscope was inserted into the bladder and the bladder was distended with saline. Survey revealed intact bladder and bilateral ureteral jets were appreciated. The bladder was drained and foley reintroduced. A speculum was inserted into the vagina and vaginal cuff appeared intact and hemostatic. A manual exam again confirmed the vaginal cuff was intact without defect.    The patient was awakened from anesthesia and taken to the recovery room in stable condition.  M.Luther RedoMD 05/24/22 1:42 PM

## 2022-05-24 NOTE — Brief Op Note (Signed)
05/24/2022  1:33 PM  PATIENT:  Chelsea Robbins  51 y.o. female  PRE-OPERATIVE DIAGNOSIS:  uterine leiomyomata  POST-OPERATIVE DIAGNOSIS:  uterine leiomyomata  PROCEDURE:  Procedure(s): XI ROBOTIC ASSISTED TOTAL LAPAROSCOPIC HYSTERECTOMY WITH BILATERAL SALPINGO OOPHORECTOMY (N/A) CYSTOSCOPY (N/A)  SURGEON:  Surgeon(s) and Role:    * Ashrita Chrismer, Edwinna Areola, MD - Primary    * Bobbye Charleston, MD - Assisting   ANESTHESIA:   general  EBL:  75 mL   BLOOD ADMINISTERED:none  DRAINS: Urinary Catheter (Foley)   LOCAL MEDICATIONS USED:  OTHER  Ropivacaine intraperitoneal  SPECIMEN:  Source of Specimen:  uterus, cervix, bilateral fallopian tubes  DISPOSITION OF SPECIMEN:  PATHOLOGY  COUNTS:  YES  TOURNIQUET:  * No tourniquets in log *  DICTATION: .Note written in EPIC  PLAN OF CARE: Admit for overnight observation  PATIENT DISPOSITION:  PACU - hemodynamically stable.   Delay start of Pharmacological VTE agent (>24hrs) due to surgical blood loss or risk of bleeding: not applicable

## 2022-05-25 ENCOUNTER — Encounter (HOSPITAL_BASED_OUTPATIENT_CLINIC_OR_DEPARTMENT_OTHER): Payer: Self-pay | Admitting: Obstetrics and Gynecology

## 2022-05-25 DIAGNOSIS — D251 Intramural leiomyoma of uterus: Secondary | ICD-10-CM | POA: Diagnosis not present

## 2022-05-25 LAB — CBC WITH DIFFERENTIAL/PLATELET
Abs Immature Granulocytes: 0.07 10*3/uL (ref 0.00–0.07)
Basophils Absolute: 0 10*3/uL (ref 0.0–0.1)
Basophils Relative: 0 %
Eosinophils Absolute: 0 10*3/uL (ref 0.0–0.5)
Eosinophils Relative: 0 %
HCT: 33.1 % — ABNORMAL LOW (ref 36.0–46.0)
Hemoglobin: 10.3 g/dL — ABNORMAL LOW (ref 12.0–15.0)
Immature Granulocytes: 1 %
Lymphocytes Relative: 11 %
Lymphs Abs: 1.4 10*3/uL (ref 0.7–4.0)
MCH: 25.4 pg — ABNORMAL LOW (ref 26.0–34.0)
MCHC: 31.1 g/dL (ref 30.0–36.0)
MCV: 81.7 fL (ref 80.0–100.0)
Monocytes Absolute: 0.7 10*3/uL (ref 0.1–1.0)
Monocytes Relative: 6 %
Neutro Abs: 10.1 10*3/uL — ABNORMAL HIGH (ref 1.7–7.7)
Neutrophils Relative %: 82 %
Platelets: 275 10*3/uL (ref 150–400)
RBC: 4.05 MIL/uL (ref 3.87–5.11)
RDW: 15.8 % — ABNORMAL HIGH (ref 11.5–15.5)
WBC: 12.3 10*3/uL — ABNORMAL HIGH (ref 4.0–10.5)
nRBC: 0 % (ref 0.0–0.2)

## 2022-05-25 MED ORDER — KETOROLAC TROMETHAMINE 30 MG/ML IJ SOLN
INTRAMUSCULAR | Status: AC
  Filled 2022-05-25: qty 1

## 2022-05-25 MED ORDER — ACETAMINOPHEN 500 MG PO TABS
ORAL_TABLET | ORAL | Status: AC
  Filled 2022-05-25: qty 2

## 2022-05-25 MED ORDER — SIMETHICONE 80 MG PO CHEW: 80.0000 mg | CHEWABLE_TABLET | Freq: Four times a day (QID) | ORAL | 0 refills | Status: DC | PRN

## 2022-05-25 MED ORDER — ACETAMINOPHEN 325 MG PO TABS: 650.0000 mg | ORAL_TABLET | Freq: Four times a day (QID) | ORAL | Status: DC | PRN

## 2022-05-25 MED ORDER — POLYETHYLENE GLYCOL 3350 17 G PO PACK: 17.0000 g | PACK | Freq: Every day | ORAL | 0 refills | Status: DC | PRN

## 2022-05-25 NOTE — Progress Notes (Signed)
GYN Progress Note  S: Doing well. Pain ranging from 0-3. Ambulating, voiding, tolerating PO. Had a quarter size clot on pad after she stood up for first time last night, only light spotting since.    O: Today's Vitals   05/24/22 2000 05/24/22 2230 05/25/22 0100 05/25/22 0500  BP: (!) 140/68 136/64 (!) 109/55 127/60  Pulse: 83 87 87 69  Resp: '16 16 16 16  '$ Temp: 98.8 F (37.1 C) 99 F (37.2 C) 98.7 F (37.1 C) 98.4 F (36.9 C)  TempSrc:      SpO2: 97% 96% 98% 99%  Weight:      Height:      PainSc: 0-No pain '2  3  1    '$ Body mass index is 42.38 kg/m.  General: no acute distress, resting in bed CVS: regular rate and rhythm Lungs: clear to auscultation bilaterally Abd: soft, minimally distended, appropriately tender to palpation Incisions: 4 lap port site incisions clean dry and intact with skin glue Ext: no calf edema or tenderness, SCDs on and cycling  CBC    Component Value Date/Time   WBC 12.3 (H) 05/25/2022 0443   RBC 4.05 05/25/2022 0443   HGB 10.3 (L) 05/25/2022 0443   HGB 11.4 04/06/2022 0920   HCT 33.1 (L) 05/25/2022 0443   HCT 36.2 04/06/2022 0920   PLT 275 05/25/2022 0443   PLT 354 04/06/2022 0920   MCV 81.7 05/25/2022 0443   MCV 76 (L) 04/06/2022 0920   MCH 25.4 (L) 05/25/2022 0443   MCHC 31.1 05/25/2022 0443   RDW 15.8 (H) 05/25/2022 0443   RDW 17.7 (H) 04/06/2022 0920   LYMPHSABS 1.4 05/25/2022 0443   LYMPHSABS 1.6 04/06/2022 0920   MONOABS 0.7 05/25/2022 0443   EOSABS 0.0 05/25/2022 0443   EOSABS 0.3 04/06/2022 0920   BASOSABS 0.0 05/25/2022 0443   BASOSABS 0.1 04/06/2022 0920    A/P: POD#1 s/p RA-TLH, bilateral salpingectomy, cysto - AM CBC appropriate - Meeting postop milestones and stable for discharge  M. Brien Mates, MD 05/25/22 7:06 AM

## 2022-05-25 NOTE — Discharge Summary (Signed)
Physician Discharge Summary  Patient ID: Chelsea Robbins MRN: [id number] DOB/AGE: [birth year] 51 y.o.  Admit date: 05/24/2022 Discharge date: 05/25/2022  Admission Diagnoses: Fibroids  Discharge Diagnoses:  Principal Problem:   H/O total hysterectomy   Discharged Condition: good  Hospital Course:  9/27 Admitted for scheduled procedure. Underwent RATLH, bilateral salpingectomy, cysto.  9/28 Meeting postop milestones, discharged home  Consults: None  Significant Diagnostic Studies:     Latest Ref Rng & Units 05/25/2022    4:43 AM 05/19/2022   11:30 AM 04/06/2022    9:20 AM  CBC  WBC 4.0 - 10.5 K/uL 12.3  8.8  7.6   Hemoglobin 12.0 - 15.0 g/dL 10.3  12.3  11.4   Hematocrit 36.0 - 46.0 % 33.1  39.9  36.2   Platelets 150 - 400 K/uL 275  309  354      Treatments: surgery: as above  Discharge Exam: Blood pressure 127/60, pulse 69, temperature 98.4 F (36.9 C), resp. rate 16, height '5\' 5"'$  (1.651 m), weight 115.5 kg, last menstrual period 04/11/2022, SpO2 99 %. General: no acute distress, resting in bed CVS: regular rate and rhythm Lungs: clear to auscultation bilaterally Abd: soft, minimally distended, appropriately tender to palpation Incisions: 4 lap port site incisions clean dry and intact with skin glue Ext: no calf edema or tenderness, SCDs on and cycling  Disposition: Discharge disposition: 01-Home or Self Care       Discharge Instructions     Call MD for:   Complete by: As directed    Persistent heavy vaginal bleeding or abnormal vaginal discharge   Call MD for:  difficulty breathing, headache or visual disturbances   Complete by: As directed    Call MD for:  extreme fatigue   Complete by: As directed    Call MD for:  hives   Complete by: As directed    Call MD for:  persistant dizziness or light-headedness   Complete by: As directed    Call MD for:  persistant nausea and vomiting   Complete by: As directed    Call MD for:  redness, tenderness, or signs of  infection (pain, swelling, redness, odor or green/yellow discharge around incision site)   Complete by: As directed    Call MD for:  severe uncontrolled pain   Complete by: As directed    Call MD for:  temperature >100.4   Complete by: As directed    Diet - low sodium heart healthy   Complete by: As directed    Driving Restrictions   Complete by: As directed    No driving while taking narcotics   Increase activity slowly   Complete by: As directed    Lifting restrictions   Complete by: As directed    No heavy lifting more than 10 lbs   No dressing needed   Complete by: As directed    Other Restrictions   Complete by: As directed    No soaking in tub/pool   Sexual Activity Restrictions   Complete by: As directed    Nothing in vagina x 6 weeks      Allergies as of 05/25/2022       Reactions   Aleve [naproxen] Hives   Pt says bottle was expired, has avoided since   Diclofenac    Flushing         Medication List     STOP taking these medications    Hydrocortisone Ace-Pramoxine 2.5-1 % Crea   progesterone 200 MG capsule Commonly  known as: PROMETRIUM       TAKE these medications    acetaminophen 325 MG tablet Commonly known as: TYLENOL Take 2 tablets (650 mg total) by mouth every 6 (six) hours as needed.   docusate sodium 100 MG capsule Commonly known as: COLACE Take 100 mg by mouth 2 (two) times daily.   ibuprofen 600 MG tablet Commonly known as: ADVIL Take 1 tablet (600 mg total) by mouth every 6 (six) hours as needed.   Iron 325 (65 Fe) MG Tabs Take by mouth. 1 tab per week   oxyCODONE 5 MG immediate release tablet Commonly known as: Oxy IR/ROXICODONE Take 1 tablet (5 mg total) by mouth every 4 (four) hours as needed.   polyethylene glycol 17 g packet Commonly known as: MIRALAX / GLYCOLAX Take 17 g by mouth daily as needed for mild constipation.   simethicone 80 MG chewable tablet Commonly known as: MYLICON Chew 1 tablet (80 mg total) by mouth  4 (four) times daily as needed for flatulence.   triamcinolone cream 0.5 % Commonly known as: KENALOG triamcinolone acetonide 0.5 % topical cream  APPLY 1 APPLICATION TO THE AFFECTED AREA TWICE DAILY X 2 WEEKS. DO NOT APPLY TO FACE   triamterene-hydrochlorothiazide 37.5-25 MG tablet Commonly known as: MAXZIDE-25 TAKE 2 TABLETS BY MOUTH EVERY MORNING What changed: how much to take   VITAMIN D3 PO Take by mouth. Takes between 2000 and 5000 units.               Discharge Care Instructions  (From admission, onward)           Start     Ordered   05/25/22 0000  No dressing needed        05/25/22 4818            Follow-up Information     Rowland Lathe, MD Follow up on 06/08/2022.   Specialty: Obstetrics and Gynecology Contact information: [address] [place] [address] [phone]                 Signed: Rowland Lathe 05/25/2022, 7:07 AM

## 2022-05-26 LAB — SURGICAL PATHOLOGY

## 2022-11-22 ENCOUNTER — Encounter: Payer: Self-pay | Admitting: Family Medicine

## 2022-11-22 LAB — HM MAMMOGRAPHY

## 2023-03-14 ENCOUNTER — Other Ambulatory Visit: Payer: Self-pay

## 2023-03-14 DIAGNOSIS — Z130 Encounter for screening for diseases of the blood and blood-forming organs and certain disorders involving the immune mechanism: Secondary | ICD-10-CM

## 2023-03-14 DIAGNOSIS — Z0000 Encounter for general adult medical examination without abnormal findings: Secondary | ICD-10-CM

## 2023-03-23 ENCOUNTER — Other Ambulatory Visit: Payer: 59

## 2023-03-23 DIAGNOSIS — Z0000 Encounter for general adult medical examination without abnormal findings: Secondary | ICD-10-CM

## 2023-03-23 DIAGNOSIS — Z13228 Encounter for screening for other metabolic disorders: Secondary | ICD-10-CM

## 2023-03-23 DIAGNOSIS — Z130 Encounter for screening for diseases of the blood and blood-forming organs and certain disorders involving the immune mechanism: Secondary | ICD-10-CM

## 2023-03-30 ENCOUNTER — Ambulatory Visit (INDEPENDENT_AMBULATORY_CARE_PROVIDER_SITE_OTHER): Payer: 59 | Admitting: Family Medicine

## 2023-03-30 ENCOUNTER — Encounter: Payer: Self-pay | Admitting: Family Medicine

## 2023-03-30 DIAGNOSIS — K5904 Chronic idiopathic constipation: Secondary | ICD-10-CM | POA: Diagnosis not present

## 2023-03-30 DIAGNOSIS — K5900 Constipation, unspecified: Secondary | ICD-10-CM | POA: Insufficient documentation

## 2023-03-30 DIAGNOSIS — E669 Obesity, unspecified: Secondary | ICD-10-CM

## 2023-03-30 NOTE — Assessment & Plan Note (Signed)
Recommended Metamucil fiber or MiraLAX.  Goal of soft bowel meant every 1 to 2 days.

## 2023-03-30 NOTE — Patient Instructions (Signed)
It was nice to see you today,  We addressed the following topics today: - we will let you know the results of the lab tests for your calcium level when we get them.   - use metamucil fiber supplements or miralax as needed with the goal of having a soft bowel movement every 1-2 days.    Have a great day,  [name], MD

## 2023-03-30 NOTE — Assessment & Plan Note (Signed)
Currently on a weight loss program.  Called [employer].  Continue to monitor

## 2023-03-30 NOTE — Assessment & Plan Note (Signed)
Mildly elevated.  Recheck with PTH.

## 2023-03-30 NOTE — Progress Notes (Signed)
   Established Patient Office Visit  Subjective   Patient ID: Chelsea Robbins, female    DOB: Dec 31, 1970  Age: 52 y.o. MRN: [id number]  Chief Complaint  Patient presents with   Annual Exam    HPI  HTN-patient taking her Maxide without any complaints.  No side effects.  Blood pressure controlled today.      Overactive bladder-patient taking oxybutynin.  Causes dry mouth but otherwise tolerating it fine.  Prescribed by her OB/GYN  Discussed patient's calcium level which was slightly elevated on most recent lab work.  Discussed repeating it in doing further workup if needed.  Patient has been having constipation for the past few weeks.  Has been taking Colace.  Does not feel like it is helping.  She has a bowel movement every 5 days.  Stool is hard.  She has not tried other treatments.  Patient recently started a weight loss program called [employer] in which she has prepackaged meals that she eats.  Has already lost some weight on this.  Patient caught her foot on some rocks 2 weeks ago.  Has not appeared infected.  Is not painful.  Patient complains of chronic neuropathy in her feet.  Does not have a history of diabetes.  Is not a heavy alcohol user.  Also feels like there is a "rolled up sock" on the ball of her feet sometimes.  No numbness, no weakness.  The 10-year ASCVD risk score (Arnett DK, et al., 2019) is: 3.9%     ROS    Objective:     BP 121/78   Pulse 73   Ht 5\' 5"  (1.651 m)   Wt 223 lb (101.2 kg)   LMP 04/11/2022 (Approximate) Comment: Intermittent spotting since  SpO2 97%   BMI 37.11 kg/m    Physical Exam General: Alert and oriented CV: Regular rate and rhythm Pulmonary: Lungs clear bilaterally Extremities: Small well-healing cut on the plantar aspect of left foot.  Sensation intact in the feet bilaterally   No results found for any visits on 03/30/23.        Assessment & Plan:   Hypercalcemia Assessment & Plan: Mildly elevated.  Recheck with  PTH.  Orders: -     PTH, intact and calcium; Future  Obesity (BMI 30-39.9) Assessment & Plan: Currently on a weight loss program.  Called [employer].  Continue to monitor   Chronic idiopathic constipation Assessment & Plan: Recommended Metamucil fiber or MiraLAX.  Goal of soft bowel meant every 1 to 2 days.      Return in about 3 months (around 06/30/2023) for HTN.    Sandre Kitty, MD

## 2023-04-06 ENCOUNTER — Other Ambulatory Visit: Payer: 59

## 2023-04-27 ENCOUNTER — Telehealth: Payer: Self-pay

## 2023-04-27 NOTE — Telephone Encounter (Signed)
Pt is calling in referencing the 07-Apr-2023 appt not being coded as a physical and would like it updated or explanation as to why its a different code.

## 2023-07-06 ENCOUNTER — Ambulatory Visit (INDEPENDENT_AMBULATORY_CARE_PROVIDER_SITE_OTHER): Payer: 59 | Admitting: Family Medicine

## 2023-07-06 ENCOUNTER — Encounter: Payer: Self-pay | Admitting: Family Medicine

## 2023-07-06 VITALS — BP 118/74 | HR 64 | Ht 65.0 in | Wt 218.8 lb

## 2023-07-06 DIAGNOSIS — I10 Essential (primary) hypertension: Secondary | ICD-10-CM | POA: Diagnosis not present

## 2023-07-06 DIAGNOSIS — E669 Obesity, unspecified: Secondary | ICD-10-CM

## 2023-07-06 DIAGNOSIS — R7303 Prediabetes: Secondary | ICD-10-CM | POA: Diagnosis not present

## 2023-07-06 DIAGNOSIS — R42 Dizziness and giddiness: Secondary | ICD-10-CM | POA: Insufficient documentation

## 2023-07-06 NOTE — Assessment & Plan Note (Signed)
Brief episodes of intermittent lightheadedness a few times a week that last a few seconds.  Possibly due to low blood pressure.  Advised patient to check her blood pressure the next time this occurs.  Also recommended trying compression stockings.

## 2023-07-06 NOTE — Assessment & Plan Note (Signed)
Controlled.  Continue Maxide 1.5 tabs a day.  Advised patient to check her blood pressure more regularly at home and during episodes of lightheadedness.  Patient does not think she has ever had any home readings under 100 systolic.

## 2023-07-06 NOTE — Assessment & Plan Note (Signed)
At some point the patient had a diagnosis of diabetes in her chart and now it is recommended that she gets ophthalmology screenings and diabetic nephropathy screenings.  She has never been diagnosed with diabetes only prediabetes.  Will need to work on getting this removed.  Will recheck A1c in 6 months at next visit.

## 2023-07-06 NOTE — Assessment & Plan Note (Signed)
Repeat calcium with PTH was normal.  No further workup.

## 2023-07-06 NOTE — Patient Instructions (Addendum)
It was nice to see you today,  We addressed the following topics today: -I will work on getting the diagnosis of diabetes removed from your chart - For lightheadedness, if you have another episode check your blood pressure to see if it is low.  Another thing you can do is try wearing compression stockings.  You can use a light pressure compression stocking at first. - I will see back in 6 months and we will recheck your labs. - No changes to your medications at this time.  Have a great day,  [name], MD

## 2023-07-06 NOTE — Assessment & Plan Note (Signed)
Not been as adherent with her [employer] diet and has subsequently had less weight loss.  Patient will restart her diet after the holidays.

## 2023-07-06 NOTE — Progress Notes (Signed)
Established Patient Office Visit  Subjective   Patient ID: Chelsea Robbins, female    DOB: 1971/01/07  Age: 52 y.o. MRN: [id number]  Chief Complaint  Patient presents with   Medical Management of Chronic Issues    HPI  Obesity-patient has lost approximately 5 pounds since our last visit.  Patient attributes this to not being as adherent on her [employer] diet.  Overall since April she has lost about 40 pounds.  She says her goal is just to get through the holidays without gaining weight.  Patient states she gets occasional episodes of lightheadedness.  Unsure, but she believes it only happens when she is standing (not always when she gets up to stand).  Lasts a few seconds and then resolves.  Happens a few times a week.  She does not check her blood pressure regularly at home.  Hypertension-patient still taking her Maxide 1.5 tablets.  Patient switching her eye doctor and has an eye doctor appointment coming up.  Patient's neuropathy in the lower extremities is about the same.  It is intermittent and does not last a significant period of time.  It is bilateral.  We discussed the patient having a diagnosis of diabetes somewhere in her chart.  Patient believes it is because when she had neuropathy testing done she was told she had diabetic neuropathy by the specialist.  Highest her A1c has ever been was 6.2 and she is not on any medications for diabetes.   The 10-year ASCVD risk score (Arnett DK, et al., 2019) is: 3.8%  Health Maintenance Due  Topic Date Due   OPHTHALMOLOGY EXAM  Never done   HIV Screening  Never done   Diabetic kidney evaluation - Urine ACR  Never done   Hepatitis C Screening  Never done   COVID-19 Vaccine (3 - 2023-24 season) 04/29/2023      Objective:     BP 118/74   Pulse 64   Ht 5\' 5"  (1.651 m)   Wt 218 lb 12.8 oz (99.2 kg)   LMP 04/11/2022 (Approximate) Comment: Intermittent spotting since  SpO2 98%   BMI 36.41 kg/m    Physical Exam General: Alert,  oriented CV: Regular rhythm Pulmonary: Lungs clear bilaterally anteriorly.   No results found for any visits on 07/06/23.      Assessment & Plan:   Primary hypertension Assessment & Plan: Controlled.  Continue Maxide 1.5 tabs a day.  Advised patient to check her blood pressure more regularly at home and during episodes of lightheadedness.  Patient does not think she has ever had any home readings under 100 systolic.   Pre-diabetes Assessment & Plan:  At some point the patient had a diagnosis of diabetes in her chart and now it is recommended that she gets ophthalmology screenings and diabetic nephropathy screenings.  She has never been diagnosed with diabetes only prediabetes.  Will need to work on getting this removed.  Will recheck A1c in 6 months at next visit.     Hypercalcemia Assessment & Plan: Repeat calcium with PTH was normal.  No further workup.   Obesity (BMI 30-39.9) Assessment & Plan: Not been as adherent with her [employer] diet and has subsequently had less weight loss.  Patient will restart her diet after the holidays.   Intermittent lightheadedness Assessment & Plan: Brief episodes of intermittent lightheadedness a few times a week that last a few seconds.  Possibly due to low blood pressure.  Advised patient to check her blood pressure the next time this  occurs.  Also recommended trying compression stockings.      Return in about 6 months (around 01/03/2024) for hld, HTN.    Sandre Kitty, MD

## 2023-08-01 ENCOUNTER — Ambulatory Visit (INDEPENDENT_AMBULATORY_CARE_PROVIDER_SITE_OTHER): Payer: 59 | Admitting: Family Medicine

## 2023-08-01 ENCOUNTER — Encounter: Payer: Self-pay | Admitting: Family Medicine

## 2023-08-01 VITALS — BP 113/76 | HR 65 | Ht 65.0 in | Wt 217.8 lb

## 2023-08-01 DIAGNOSIS — R1013 Epigastric pain: Secondary | ICD-10-CM

## 2023-08-01 NOTE — Assessment & Plan Note (Signed)
Seems more likely to be gallbladder than pancreatitis.  Will get CMP and lipase.  Ordering right upper quadrant ultrasound.  If it is related to gallstones will refer to general surgery.  Counseled her on eating habits and what foods to avoid to reduce the number of episodes of biliary colic

## 2023-08-01 NOTE — Patient Instructions (Signed)
It was nice to see you today,  We addressed the following topics today: -I believe your pain is related to your gallbladder.  I will need to get some blood test to rule out other causes such as your pancreas. - The ultrasound is to look for gallstones.  You can schedule this for January if you would like.  Someone will call you to schedule it - I would like to see you back after your ultrasound has been performed. - Eat small meals, try not to go long periods of time without eating, and try to avoid high fat meals.  Have a great day,  [name], MD

## 2023-08-01 NOTE — Progress Notes (Signed)
   Acute Office Visit  Subjective:     Patient ID: Chelsea Robbins, female    DOB: [birth year], 52 y.o.   MRN: [id number]  Chief Complaint  Patient presents with   Abdominal Pain    HPI Patient is in today for stomach pain.  Patient has had 2 episodes of epigastric pain that radiates to the back.  Both of these times were in the evening.  Second time it woke her up from sleep.  The first time she did "make herself throw up" 1 time and felt better afterwards.  Pain usually lasted a few hours.  She took a Tums with second time but otherwise did not take the medications.  Prior to the first event she had gone out to eat and had steak, fried onion rings.  Second time she is unsure which she had.  Patient is not a regular alcohol user.  No personal history of gallstones.  Unsure of her family history.  ROS      Objective:    BP 113/76   Pulse 65   Ht 5\' 5"  (1.651 m)   Wt 217 lb 12.8 oz (98.8 kg)   LMP 04/11/2022 (Approximate) Comment: Intermittent spotting since  BMI 36.24 kg/m    Physical Exam General: Alert, oriented CV: Regular rhythm Pulmonary: Lungs clear bilaterally GI: Soft, nontender.  Normal bowel sounds.  Negative Murphy sign.  No results found for any visits on 08/01/23.      Assessment & Plan:   Epigastric pain Assessment & Plan: Seems more likely to be gallbladder than pancreatitis.  Will get CMP and lipase.  Ordering right upper quadrant ultrasound.  If it is related to gallstones will refer to general surgery.  Counseled her on eating habits and what foods to avoid to reduce the number of episodes of biliary colic  Orders: -     Comprehensive metabolic panel -     Lipase -     US ABDOMEN LIMITED RUQ (LIVER/GB); Future     Return in about 2 months (around 10/02/2023) for epigastric pain.  Sandre Kitty, MD

## 2023-08-02 LAB — COMPREHENSIVE METABOLIC PANEL
ALT: 23 [IU]/L (ref 0–32)
AST: 19 [IU]/L (ref 0–40)
Albumin: 4.2 g/dL (ref 3.8–4.9)
Alkaline Phosphatase: 100 [IU]/L (ref 44–121)
BUN/Creatinine Ratio: 31 — ABNORMAL HIGH (ref 9–23)
BUN: 24 mg/dL (ref 6–24)
Bilirubin Total: 0.4 mg/dL (ref 0.0–1.2)
CO2: 26 mmol/L (ref 20–29)
Calcium: 9.7 mg/dL (ref 8.7–10.2)
Chloride: 102 mmol/L (ref 96–106)
Creatinine, Ser: 0.77 mg/dL (ref 0.57–1.00)
Globulin, Total: 2.9 g/dL (ref 1.5–4.5)
Glucose: 99 mg/dL (ref 70–99)
Potassium: 3.7 mmol/L (ref 3.5–5.2)
Sodium: 141 mmol/L (ref 134–144)
Total Protein: 7.1 g/dL (ref 6.0–8.5)
eGFR: 93 mL/min/{1.73_m2} (ref >59)

## 2023-08-02 LAB — LIPASE: Lipase: 32 U/L (ref 14–72)

## 2023-08-08 ENCOUNTER — Other Ambulatory Visit: Payer: Self-pay | Admitting: Family Medicine

## 2023-08-08 ENCOUNTER — Encounter: Payer: Self-pay | Admitting: Family Medicine

## 2023-08-08 ENCOUNTER — Ambulatory Visit
Admission: RE | Admit: 2023-08-08 | Discharge: 2023-08-08 | Disposition: A | Discharge status: Home / Self Care | Payer: 59 | Source: Ambulatory Visit | Attending: Family Medicine | Admitting: Family Medicine

## 2023-08-08 DIAGNOSIS — K8020 Calculus of gallbladder without cholecystitis without obstruction: Secondary | ICD-10-CM

## 2023-08-08 DIAGNOSIS — R1013 Epigastric pain: Secondary | ICD-10-CM

## 2023-08-13 ENCOUNTER — Ambulatory Visit: Payer: 59 | Admitting: Family Medicine

## 2023-08-23 ENCOUNTER — Ambulatory Visit: Payer: 59 | Admitting: Family Medicine

## 2023-08-24 ENCOUNTER — Ambulatory Visit: Payer: 59 | Admitting: Family Medicine

## 2023-09-01 ENCOUNTER — Encounter: Payer: Self-pay | Admitting: Family Medicine

## 2023-09-03 NOTE — Progress Notes (Signed)
 Send pre op orders for pt's PST appointment 09/11/23.

## 2023-09-03 NOTE — Progress Notes (Addendum)
 COVID Vaccine received:  []  No [x]  Yes Date of any COVID positive Test in last 90 days: no PCP - Toribio Slain MD Cardiologist - none  Chest x-ray -  EKG -  09/06/23 Epic Stress Test -  ECHO -  Cardiac Cath -   Bowel Prep - [x]  No  []   Yes ______  Pacemaker / ICD device [x]  No []  Yes   Spinal Cord Stimulator:[x]  No []  Yes       History of Sleep Apnea? [x]  No []  Yes   CPAP used?- [x]  No []  Yes    Does the patient monitor blood sugar?          [x]  No []  Yes  []  N/A  Patient has: []  NO Hx DM   [x]  Pre-DM                 []  DM1  []   DM2 Does patient have a [employer] or Dexacom? []  No []  Yes   Fasting Blood Sugar Ranges-  Checks Blood Sugar _____ times a day  GLP1 agonist / usual dose - no GLP1 instructions:  SGLT-2 inhibitors / usual dose - no SGLT-2 instructions:   Blood Thinner / Instructions:no Aspirin Instructions:no  Comments:   Activity level: Patient is able  to climb a flight of stairs without difficulty; [x]  No CP  [x]  No SOB,  ___   Patient can perform ADLs without assistance.   Anesthesia review:   Patient denies shortness of breath, fever, cough and chest pain at PAT appointment.  Patient verbalized understanding and agreement to the Pre-Surgical Instructions that were given to them at this PAT appointment. Patient was also educated of the need to review these PAT instructions again prior to his/her surgery.I reviewed the appropriate phone numbers to call if they have any and questions or concerns.

## 2023-09-03 NOTE — Patient Instructions (Addendum)
 SURGICAL WAITING ROOM VISITATION  Patients having surgery or a procedure may have no more than 2 support people in the waiting area - these visitors may rotate.    Children under the age of 38 must have an adult with them who is not the patient.  Due to an increase in RSV and influenza rates and associated hospitalizations, children ages 21 and under may not visit patients in [facility] hospitals.  If the patient needs to stay at the hospital during part of their recovery, the visitor guidelines for inpatient rooms apply. Pre-op nurse will coordinate an appropriate time for 1 support person to accompany patient in pre-op.  This support person may not rotate.    Please refer to the [facility] website for the visitor guidelines for Inpatients (after your surgery is over and you are in a regular room).       Your procedure is scheduled on: 09/11/23   [facility] Main [facility]    Report to admitting at  5:15 AM   Call this number if you have problems the morning of surgery [phone]   Do not eat food :After Midnight.   After Midnight you may have the following liquids until  4:30 AM DAY OF SURGERY  Water  Non-Citrus Juices (without pulp, NO RED-Apple, White grape, White cranberry) Black Coffee (NO MILK/CREAM OR CREAMERS, sugar ok)  Clear Tea (NO MILK/CREAM OR CREAMERS, sugar ok) regular and decaf                             Plain Jell-O (NO RED)                                           Fruit ices (not with fruit pulp, NO RED)                                     Popsicles (NO RED)                                                               Sports drinks like Gatorade (NO RED)                  The day of surgery:  Drink ONE (1) Pre-Surgery G2 at 4:30 AM the morning of surgery. Drink in one sitting. Do not sip.  This drink was given to you during your hospital  pre-op appointment visit. Nothing else to drink after completing the  Pre-Surgery  G2.      Oral Hygiene is also important to reduce your risk of infection.                                    Remember - BRUSH YOUR TEETH THE MORNING OF SURGERY WITH YOUR REGULAR TOOTHPASTE   Stop all vitamins and herbal supplements 7 days before surgery.   Take these medicines the morning of surgery with A SIP OF WATER :  none  Do not take Maxzide the morning of surgery.             You may not have any metal on your body including hair pins, jewelry, and body piercing             Do not wear make-up, lotions, powders, perfumes/cologne, or deodorant  Do not wear nail polish including gel and S&S, artificial/acrylic nails, or any other type of covering on natural nails including finger and toenails. If you have artificial nails, gel coating, etc. that needs to be removed by a nail salon please have this removed prior to surgery or surgery may need to be canceled/ delayed if the surgeon/ anesthesia feels like they are unable to be safely monitored.   Do not shave  48 hours prior to surgery.    Do not bring valuables to the hospital. [facility] IS NOT             RESPONSIBLE   FOR VALUABLES.  DO NOT BRING YOUR HOME MEDICATIONS TO THE HOSPITAL. PHARMACY WILL DISPENSE MEDICATIONS LISTED ON YOUR MEDICATION LIST TO YOU DURING YOUR ADMISSION IN THE HOSPITAL!    Patients discharged on the day of surgery will not be allowed to drive home.  Someone NEEDS to stay with you for the first 24 hours after anesthesia.   Special Instructions: Bring a copy of your healthcare power of attorney and living will documents the day of surgery if you haven't scanned them before.              Please read over the following fact sheets you were given: IF YOU HAVE QUESTIONS ABOUT YOUR PRE-OP INSTRUCTIONS PLEASE CALL [phone] [facility]   If you received a COVID test during your pre-op visit  it is requested that you wear a mask when out in public, stay away from anyone that may not be feeling well and notify  your surgeon if you develop symptoms. If you test positive for Covid or have been in contact with anyone that has tested positive in the last 10 days please notify you surgeon.     - Preparing for Surgery Before surgery, you can play an important role.  Because skin is not sterile, your skin needs to be as free of germs as possible.  You can reduce the number of germs on your skin by washing with CHG (chlorahexidine gluconate) soap before surgery.  CHG is an antiseptic cleaner which kills germs and bonds with the skin to continue killing germs even after washing. Please DO NOT use if you have an allergy to CHG or antibacterial soaps.  If your skin becomes reddened/irritated stop using the CHG and inform your nurse when you arrive at Short Stay. Do not shave (including legs and underarms) for at least 48 hours prior to the first CHG shower.  You may shave your face/neck.  Please follow these instructions carefully:  1.  Shower with CHG Soap the night before surgery and the  morning of surgery.  2.  If you choose to wash your hair, wash your hair first as usual with your normal  shampoo.  3.  After you shampoo, rinse your hair and body thoroughly to remove the shampoo.                             4.  Use CHG as you would any other liquid soap.  You can apply chg directly to the skin  and wash.  Gently with a scrungie or clean washcloth.  5.  Apply the CHG Soap to your body ONLY FROM THE NECK DOWN.   Do   not use on face/ open                           Wound or open sores. Avoid contact with eyes, ears mouth and   genitals (private parts).                       Wash face,  Genitals (private parts) with your normal soap.             6.  Wash thoroughly, paying special attention to the area where your    surgery  will be performed.  7.  Thoroughly rinse your body with warm water  from the neck down.  8.  DO NOT shower/wash with your normal soap after using and rinsing off the CHG Soap.                 9.  Pat yourself dry with a clean towel.            10.  Wear clean pajamas.            11.  Place clean sheets on your bed the night of your first shower and do not  sleep with pets. Day of Surgery : Do not apply any lotions/deodorants the morning of surgery.  Please wear clean clothes to the hospital/surgery center.  FAILURE TO FOLLOW THESE INSTRUCTIONS MAY RESULT IN THE CANCELLATION OF YOUR SURGERY  PATIENT SIGNATURE_________________________________  NURSE SIGNATURE__________________________________  ________________________________________________________________________

## 2023-09-04 ENCOUNTER — Ambulatory Visit: Payer: Self-pay | Admitting: General Surgery

## 2023-09-04 NOTE — Progress Notes (Signed)
 Sent message, via epic in basket, requesting orders in epic from Careers adviser.

## 2023-09-06 ENCOUNTER — Encounter (HOSPITAL_COMMUNITY)
Admission: RE | Admit: 2023-09-06 | Discharge: 2023-09-06 | Disposition: A | Discharge status: Home / Self Care | Payer: 59 | Source: Ambulatory Visit | Attending: General Surgery | Admitting: General Surgery

## 2023-09-06 ENCOUNTER — Other Ambulatory Visit: Payer: 59

## 2023-09-06 ENCOUNTER — Other Ambulatory Visit: Payer: Self-pay

## 2023-09-06 VITALS — BP 145/87 | HR 71 | Temp 98.7°F | Resp 16 | Ht 65.0 in | Wt 214.0 lb

## 2023-09-06 DIAGNOSIS — Z01818 Encounter for other preprocedural examination: Secondary | ICD-10-CM | POA: Insufficient documentation

## 2023-09-06 DIAGNOSIS — I10 Essential (primary) hypertension: Secondary | ICD-10-CM | POA: Insufficient documentation

## 2023-09-06 LAB — BASIC METABOLIC PANEL
Anion gap: 10 (ref 5–15)
BUN: 25 mg/dL — ABNORMAL HIGH (ref 6–20)
CO2: 22 mmol/L (ref 22–32)
Calcium: 9.6 mg/dL (ref 8.9–10.3)
Chloride: 107 mmol/L (ref 98–111)
Creatinine, Ser: 0.8 mg/dL (ref 0.44–1.00)
GFR, Estimated: >60 mL/min (ref >60)
Glucose, Bld: 82 mg/dL (ref 70–99)
Potassium: 4.2 mmol/L (ref 3.5–5.1)
Sodium: 139 mmol/L (ref 135–145)

## 2023-09-06 LAB — CBC
HCT: 45.3 % (ref 36.0–46.0)
Hemoglobin: 14.8 g/dL (ref 12.0–15.0)
MCH: 27.9 pg (ref 26.0–34.0)
MCHC: 32.7 g/dL (ref 30.0–36.0)
MCV: 85.3 fL (ref 80.0–100.0)
Platelets: 322 10*3/uL (ref 150–400)
RBC: 5.31 MIL/uL — ABNORMAL HIGH (ref 3.87–5.11)
RDW: 13.2 % (ref 11.5–15.5)
WBC: 8.7 10*3/uL (ref 4.0–10.5)
nRBC: 0 % (ref 0.0–0.2)

## 2023-09-07 ENCOUNTER — Ambulatory Visit: Payer: 59 | Admitting: Family Medicine

## 2023-09-11 ENCOUNTER — Other Ambulatory Visit: Payer: Self-pay

## 2023-09-11 ENCOUNTER — Encounter (HOSPITAL_COMMUNITY): Payer: Self-pay | Admitting: General Surgery

## 2023-09-11 ENCOUNTER — Ambulatory Visit (HOSPITAL_BASED_OUTPATIENT_CLINIC_OR_DEPARTMENT_OTHER): Payer: 59 | Admitting: Anesthesiology

## 2023-09-11 ENCOUNTER — Ambulatory Visit (HOSPITAL_COMMUNITY): Payer: 59 | Admitting: Anesthesiology

## 2023-09-11 ENCOUNTER — Ambulatory Visit (HOSPITAL_COMMUNITY)
Admission: RE | Admit: 2023-09-11 | Discharge: 2023-09-11 | Disposition: A | Discharge status: Home / Self Care | Payer: 59 | Source: Ambulatory Visit | Attending: General Surgery | Admitting: General Surgery

## 2023-09-11 ENCOUNTER — Encounter (HOSPITAL_COMMUNITY)
Admission: RE | Disposition: A | Discharge status: Home / Self Care | Payer: Self-pay | Source: Ambulatory Visit | Attending: General Surgery

## 2023-09-11 DIAGNOSIS — K8010 Calculus of gallbladder with chronic cholecystitis without obstruction: Secondary | ICD-10-CM | POA: Insufficient documentation

## 2023-09-11 DIAGNOSIS — I10 Essential (primary) hypertension: Secondary | ICD-10-CM | POA: Diagnosis not present

## 2023-09-11 DIAGNOSIS — E119 Type 2 diabetes mellitus without complications: Secondary | ICD-10-CM | POA: Insufficient documentation

## 2023-09-11 DIAGNOSIS — K8020 Calculus of gallbladder without cholecystitis without obstruction: Secondary | ICD-10-CM

## 2023-09-11 HISTORY — PX: CHOLECYSTECTOMY: SHX55

## 2023-09-11 LAB — GLUCOSE, CAPILLARY: Glucose-Capillary: 151 mg/dL — ABNORMAL HIGH (ref 70–99)

## 2023-09-11 SURGERY — LAPAROSCOPIC CHOLECYSTECTOMY
Anesthesia: General

## 2023-09-11 MED ORDER — SODIUM CHLORIDE 0.9 % IV SOLN
2.0000 g | INTRAVENOUS | Status: AC
  Administered 2023-09-11: 2 g via INTRAVENOUS
  Filled 2023-09-11: qty 2

## 2023-09-11 MED ORDER — ROCURONIUM BROMIDE 10 MG/ML (PF) SYRINGE
PREFILLED_SYRINGE | INTRAVENOUS | Status: DC | PRN
  Administered 2023-09-11: 50 mg via INTRAVENOUS

## 2023-09-11 MED ORDER — DEXAMETHASONE SODIUM PHOSPHATE 10 MG/ML IJ SOLN
INTRAMUSCULAR | Status: AC
Start: 2023-09-11 — End: ?
  Filled 2023-09-11: qty 1

## 2023-09-11 MED ORDER — LIDOCAINE 2% (20 MG/ML) 5 ML SYRINGE
INTRAMUSCULAR | Status: DC | PRN
  Administered 2023-09-11: 100 mg via INTRAVENOUS

## 2023-09-11 MED ORDER — SUGAMMADEX SODIUM 200 MG/2ML IV SOLN
INTRAVENOUS | Status: DC | PRN
  Administered 2023-09-11: 200 mg via INTRAVENOUS

## 2023-09-11 MED ORDER — STERILE WATER FOR IRRIGATION IR SOLN
Status: DC | PRN
  Administered 2023-09-11: 1000 mL

## 2023-09-11 MED ORDER — FENTANYL CITRATE PF 50 MCG/ML IJ SOSY: 25.0000 ug | PREFILLED_SYRINGE | INTRAMUSCULAR | Status: DC | PRN

## 2023-09-11 MED ORDER — 0.9 % SODIUM CHLORIDE (POUR BTL) OPTIME
TOPICAL | Status: DC | PRN
  Administered 2023-09-11: 1000 mL

## 2023-09-11 MED ORDER — FENTANYL CITRATE (PF) 250 MCG/5ML IJ SOLN
INTRAMUSCULAR | Status: AC
  Filled 2023-09-11: qty 5

## 2023-09-11 MED ORDER — BUPIVACAINE-EPINEPHRINE 0.25% -1:200000 IJ SOLN
INTRAMUSCULAR | Status: AC
  Filled 2023-09-11: qty 1

## 2023-09-11 MED ORDER — MIDAZOLAM HCL 2 MG/2ML IJ SOLN
INTRAMUSCULAR | Status: AC
  Filled 2023-09-11: qty 2

## 2023-09-11 MED ORDER — ROCURONIUM BROMIDE 10 MG/ML (PF) SYRINGE
PREFILLED_SYRINGE | INTRAVENOUS | Status: AC
  Filled 2023-09-11: qty 10

## 2023-09-11 MED ORDER — ACETAMINOPHEN 500 MG PO TABS
1000.0000 mg | ORAL_TABLET | ORAL | Status: AC
  Administered 2023-09-11: 1000 mg via ORAL
  Filled 2023-09-11: qty 2

## 2023-09-11 MED ORDER — LACTATED RINGERS IV SOLN
INTRAVENOUS | Status: DC
Start: 2023-09-11 — End: 2023-09-11

## 2023-09-11 MED ORDER — CHLORHEXIDINE GLUCONATE 0.12 % MT SOLN
15.0000 mL | Freq: Once | OROMUCOSAL | Status: AC
Start: 2023-09-11 — End: 2023-09-11
  Administered 2023-09-11: 15 mL via OROMUCOSAL

## 2023-09-11 MED ORDER — BUPIVACAINE-EPINEPHRINE 0.25% -1:200000 IJ SOLN
INTRAMUSCULAR | Status: DC | PRN
  Administered 2023-09-11: 40 mL

## 2023-09-11 MED ORDER — CHLORHEXIDINE GLUCONATE CLOTH 2 % EX PADS: 6.0000 | MEDICATED_PAD | Freq: Once | CUTANEOUS | Status: DC

## 2023-09-11 MED ORDER — ONDANSETRON HCL 4 MG/2ML IJ SOLN
INTRAMUSCULAR | Status: AC
  Filled 2023-09-11: qty 2

## 2023-09-11 MED ORDER — ONDANSETRON HCL 4 MG/2ML IJ SOLN
INTRAMUSCULAR | Status: DC | PRN
  Administered 2023-09-11: 4 mg via INTRAVENOUS

## 2023-09-11 MED ORDER — ORAL CARE MOUTH RINSE: 15.0000 mL | Freq: Once | OROMUCOSAL | Status: AC

## 2023-09-11 MED ORDER — LIDOCAINE HCL (PF) 2 % IJ SOLN
INTRAMUSCULAR | Status: AC
  Filled 2023-09-11: qty 5

## 2023-09-11 MED ORDER — DEXAMETHASONE SODIUM PHOSPHATE 10 MG/ML IJ SOLN
INTRAMUSCULAR | Status: DC | PRN
  Administered 2023-09-11: 10 mg via INTRAVENOUS

## 2023-09-11 MED ORDER — SPY AGENT GREEN - (INDOCYANINE FOR INJECTION)
1.2500 mg | Freq: Once | INTRAMUSCULAR | Status: AC
  Administered 2023-09-11: 1.25 mg via INTRAVENOUS
  Filled 2023-09-11: qty 10

## 2023-09-11 MED ORDER — FENTANYL CITRATE (PF) 250 MCG/5ML IJ SOLN
INTRAMUSCULAR | Status: DC | PRN
  Administered 2023-09-11 (×5): 50 ug via INTRAVENOUS

## 2023-09-11 MED ORDER — PROPOFOL 10 MG/ML IV BOLUS
INTRAVENOUS | Status: DC | PRN
  Administered 2023-09-11: 150 mg via INTRAVENOUS

## 2023-09-11 MED ORDER — ACETAMINOPHEN 500 MG PO TABS: 1000.0000 mg | ORAL_TABLET | Freq: Three times a day (TID) | ORAL | Status: AC

## 2023-09-11 MED ORDER — MIDAZOLAM HCL 2 MG/2ML IJ SOLN
INTRAMUSCULAR | Status: DC | PRN
  Administered 2023-09-11: 2 mg via INTRAVENOUS

## 2023-09-11 MED ORDER — PROPOFOL 10 MG/ML IV BOLUS
INTRAVENOUS | Status: AC
  Filled 2023-09-11: qty 20

## 2023-09-11 MED ORDER — ACETAMINOPHEN 500 MG PO TABS
ORAL_TABLET | ORAL | Status: AC
  Filled 2023-09-11: qty 1

## 2023-09-11 MED ORDER — LACTATED RINGERS IV SOLN: INTRAVENOUS | Status: DC | PRN

## 2023-09-11 SURGICAL SUPPLY — 46 items
APPLICATOR ARISTA FLEXITIP XL (MISCELLANEOUS) IMPLANT
APPLIER CLIP 5 13 M/L LIGAMAX5 (MISCELLANEOUS) ×1 IMPLANT
APPLIER CLIP ROT 10 11.4 M/L (STAPLE) IMPLANT
BAG COUNTER SPONGE SURGICOUNT (BAG) IMPLANT
CABLE HIGH FREQUENCY MONO STRZ (ELECTRODE) ×1 IMPLANT
CHLORAPREP W/TINT 26 (MISCELLANEOUS) ×1 IMPLANT
CLIP APPLIE 5 13 M/L LIGAMAX5 (MISCELLANEOUS) IMPLANT
CLIP APPLIE ROT 10 11.4 M/L (STAPLE) IMPLANT
CLIP LIGATING HEMO O LOK GREEN (MISCELLANEOUS) IMPLANT
COVER MAYO STAND XLG (MISCELLANEOUS) IMPLANT
COVER SURGICAL LIGHT HANDLE (MISCELLANEOUS) ×1 IMPLANT
DERMABOND ADVANCED .7 DNX12 (GAUZE/BANDAGES/DRESSINGS) IMPLANT
DRAPE C-ARM 42X120 X-RAY (DRAPES) IMPLANT
DRSG TEGADERM 2-3/8X2-3/4 SM (GAUZE/BANDAGES/DRESSINGS) ×3 IMPLANT
DRSG TEGADERM 4X4.75 (GAUZE/BANDAGES/DRESSINGS) ×1 IMPLANT
ELECT REM PT RETURN 15FT ADLT (MISCELLANEOUS) ×1 IMPLANT
GAUZE SPONGE 2X2 8PLY STRL LF (GAUZE/BANDAGES/DRESSINGS) ×1 IMPLANT
GLOVE BIO SURGEON STRL SZ7.5 (GLOVE) ×1 IMPLANT
GLOVE INDICATOR 8.0 STRL GRN (GLOVE) ×1 IMPLANT
GOWN STRL REUS W/ TWL XL LVL3 (GOWN DISPOSABLE) ×1 IMPLANT
GRASPER SUT TROCAR 14GX15 (MISCELLANEOUS) IMPLANT
HEMOSTAT ARISTA ABSORB 3G PWDR (HEMOSTASIS) IMPLANT
HEMOSTAT SNOW SURGICEL 2X4 (HEMOSTASIS) IMPLANT
IRRIG SUCT STRYKERFLOW 2 WTIP (MISCELLANEOUS) ×1 IMPLANT
IRRIGATION SUCT STRKRFLW 2 WTP (MISCELLANEOUS) ×1 IMPLANT
KIT BASIN OR (CUSTOM PROCEDURE TRAY) ×1 IMPLANT
KIT TURNOVER KIT A (KITS) IMPLANT
L-HOOK LAP DISP 36CM (ELECTROSURGICAL) IMPLANT
LHOOK LAP DISP 36CM (ELECTROSURGICAL) IMPLANT
POUCH RETRIEVAL ECOSAC 10 (ENDOMECHANICALS) ×1 IMPLANT
SCISSORS LAP 5X35 DISP (ENDOMECHANICALS) ×1 IMPLANT
SET CHOLANGIOGRAPH MIX (MISCELLANEOUS) IMPLANT
SET TUBE SMOKE EVAC HIGH FLOW (TUBING) ×1 IMPLANT
SLEEVE ADV FIXATION 5X100MM (TROCAR) ×1 IMPLANT
SPIKE FLUID TRANSFER (MISCELLANEOUS) ×1 IMPLANT
STRIP CLOSURE SKIN 1/2X4 (GAUZE/BANDAGES/DRESSINGS) ×1 IMPLANT
SUT MNCRL AB 4-0 PS2 18 (SUTURE) ×1 IMPLANT
SUT VIC AB 0 UR5 27 (SUTURE) IMPLANT
SUT VICRYL 0 TIES 12 18 (SUTURE) IMPLANT
SUT VICRYL 0 UR6 27IN ABS (SUTURE) IMPLANT
TOWEL OR 17X26 10 PK STRL BLUE (TOWEL DISPOSABLE) ×1 IMPLANT
TRAY LAPAROSCOPIC (CUSTOM PROCEDURE TRAY) ×1 IMPLANT
TROCAR ADV FIXATION 12X100MM (TROCAR) IMPLANT
TROCAR ADV FIXATION 5X100MM (TROCAR) ×1 IMPLANT
TROCAR BALLN 12MMX100 BLUNT (TROCAR) IMPLANT
TROCAR XCEL NON-BLD 5MMX100MML (ENDOMECHANICALS) IMPLANT

## 2023-09-11 NOTE — H&P (Signed)
 REFERRING PHYSICIAN: Self  PROVIDER: Mirella Gueye Chelsea BLUSH, MD  MRN: [id number] DOB: 22-Oct-1970 DATE OF ENCOUNTER: 08/31/2023  Subjective  Chief Complaint: New Patient (Gallbladder without cholecystitis without obstruction )   History of Present Illness: Chelsea Robbins is a 53 y.o. female who is seen today as an office consultation at the request of Dr. Arch for evaluation of New Patient (Gallbladder without cholecystitis without obstruction ) .  Medical history of hypertension, prediabetes  She reports several episodes of right upper quadrant pain radiating to her upper abdomen as well as to her back. These episodes typically last for a few hours and associate with nausea but no emesis. No fever or chills. She has had episodes of November 17, November 26, 2 episodes on December 7, another 1 on December 8 2 episodes on December 26. She initially tried Tums without any improvement. She has used a heating pad which has helped. Prior abdominal surgery includes a hysterectomy. She states that when she had a C-section 30 years ago she had a blood clot deep in her pelvis. She does not recall being treated with blood thinners either with injections or shots. She had a hysterectomy in 2023 with no blood thinners and did well. She has a bowel movement every 2 to 3 days. She had labs and ultrasound    Review of Systems: A complete review of systems was obtained from the patient. I have reviewed this information and discussed as appropriate with the patient. See HPI as well for other ROS.  ROS  Medical History: Past Medical History: Diagnosis Date Anemia Anxiety DVT (deep venous thrombosis) (CMS/HHS-HCC) GERD (gastroesophageal reflux disease) Hypertension  There is no problem list on file for this patient.  Past Surgical History: Procedure Laterality Date HYSTERECTOMY   No Known Allergies  Current Outpatient Medications on File Prior to Visit Medication Sig Dispense  Refill cholecalciferol (VITAMIN D3) 2,000 unit tablet Take 2,000 Units by mouth once daily triamterene -hydroCHLOROthiazide (DYAZIDE) 37.5-25 mg capsule Take 1 capsule by mouth every morning  No current facility-administered medications on file prior to visit.  History reviewed. No pertinent family history.  Social History  Tobacco Use Smoking Status Never Smokeless Tobacco Never   Social History  Socioeconomic History Marital status: Married Tobacco Use Smoking status: Never Smokeless tobacco: Never Substance and Sexual Activity Alcohol use: Never Drug use: Never  Social Drivers of Health  Housing Stability: Unknown (08/31/2023) Housing Stability Vital Sign Homeless in the Last Year: No  Objective:  Vitals: 08/31/23 1509 BP: 128/80 Pulse: 96 Temp: 37.1 C (98.8 F) SpO2: 98% Weight: 97.4 kg (214 lb 12.8 oz) Height: 165.1 cm (5' 5) PainSc: 0-No pain  Body mass index is 35.74 kg/m.  Constitutional: NAD; conversant; no deformities; class I obesity Eyes: Moist conjunctiva; no lid lag; anicteric; PERRL Neck: Trachea midline; no thyromegaly Lungs: Normal respiratory effort; no tactile fremitus CV: RRR; no palpable thrills; no pitting edema GI: Abd soft, nt, nd, old trocar scar; no palpable hepatosplenomegaly MSK: Normal gait; no clubbing/cyanosis Psychiatric: Appropriate affect; alert and oriented x3 Lymphatic: No palpable cervical or axillary lymphadenopathy Skin:no rash/lesions/jaundice  Labs, Imaging and Diagnostic Testing: Ruq u/s 08/08/23 Gallbladder:  Cholelithiasis. No gallbladder wall thickening or pericholecystic fluid. Negative sonographic Murphy's sign.  Common bile duct:  Diameter: 4 mm  Liver:  Increased echogenicity. No focal lesion. Portal vein is patent on color Doppler imaging with normal direction of blood flow towards the liver.  Other: None.  IMPRESSION: 1. Cholelithiasis without secondary signs of acute cholecystitis. 2.  Increased hepatic parenchymal echogenicity suggestive of steatosis.  08/01/23 Cmet Labs reviewed  Pcp note 08/01/23; 06/2023  Assessment and Plan:   Diagnoses and all orders for this visit:  Symptomatic cholelithiasis   I believe the patient's symptoms are consistent with gallbladder disease.  We discussed gallbladder disease. The patient was given agricultural engineer. We discussed non-operative and operative management. We discussed the signs & symptoms of acute cholecystitis  I discussed laparoscopic cholecystectomy with possible IOC in detail. The patient was given educational material as well as diagrams detailing the procedure. We discussed the risks and benefits of a laparoscopic cholecystectomy including, but not limited to bleeding, infection, injury to surrounding structures such as the intestine or liver, bile leak, retained gallstones, need to convert to an open procedure, prolonged diarrhea, blood clots such as DVT, common bile duct injury, anesthesia risks, and possible need for additional procedures. We discussed the typical post-operative recovery course. I explained that the likelihood of improvement of their symptoms is good.  I am not sure if the patient truly had a deep venous thrombosis. She has had subsequent hysterectomy surgery without any issues. We will use SCDs and will give her one-time dose of preoperative subcutaneous heparin.  She would like to meet with the schedulers and get scheduled for surgery.  This patient encounter took 31 minutes today to perform the following: take history, perform exam, review outside records, interpret imaging, counsel the patient on their diagnosis and document encounter, findings & plan in the EHR  No follow-ups on file.  [name] Chelsea BLUSH, MD General, Minimally Invasive, & Bariatric Surgery     Electronically signed by Robbins Camellia Daralyn, MD at 08/31/2023 3:32 PM EST

## 2023-09-11 NOTE — Anesthesia Preprocedure Evaluation (Signed)
 Anesthesia Evaluation  Patient identified by MRN, date of birth, ID band Patient awake    Reviewed: Allergy & Precautions, NPO status , Patient's Chart, lab work & pertinent test results  Airway Mallampati: II  TM Distance: >3 FB Neck ROM: Full    Dental no notable dental hx.    Pulmonary neg pulmonary ROS   Pulmonary exam normal        Cardiovascular hypertension,  Rhythm:Regular Rate:Normal     Neuro/Psych  Headaches  Anxiety        GI/Hepatic Neg liver ROS,GERD  ,,Cholelithiasis    Endo/Other  diabetes    Renal/GU negative Renal ROS  negative genitourinary   Musculoskeletal negative musculoskeletal ROS (+)    Abdominal Normal abdominal exam  (+)   Peds  Hematology  (+) Blood dyscrasia, anemia Lab Results      Component                Value               Date                      WBC                      8.7                 09/06/2023                HGB                      14.8                09/06/2023                HCT                      45.3                09/06/2023                MCV                      85.3                09/06/2023                PLT                      322                 09/06/2023              Anesthesia Other Findings   Reproductive/Obstetrics                             Anesthesia Physical Anesthesia Plan  ASA: 2  Anesthesia Plan: General   Post-op Pain Management: Tylenol  PO (pre-op)*   Induction: Intravenous  PONV Risk Score and Plan: 3 and Ondansetron , Dexamethasone , Midazolam  and Treatment may vary due to age or medical condition  Airway Management Planned: Mask and Oral ETT  Additional Equipment: None  Intra-op Plan:   Post-operative Plan: Extubation in OR  Informed Consent: I have reviewed the patients History and Physical, chart, labs and discussed the procedure including the risks, benefits and alternatives for the proposed  anesthesia with the  patient or authorized representative who has indicated his/her understanding and acceptance.     Dental advisory given  Plan Discussed with: CRNA  Anesthesia Plan Comments:        Anesthesia Quick Evaluation

## 2023-09-11 NOTE — Anesthesia Postprocedure Evaluation (Signed)
 Anesthesia Post Note  Patient: Chelsea Robbins  Procedure(s) Performed: LAPAROSCOPIC CHOLECYSTECTOMY WITH ICG DYE INDOCYANINE GREEN  FLUORESCENCE IMAGING (ICG)     Patient location during evaluation: PACU Anesthesia Type: General Level of consciousness: awake and alert Pain management: pain level controlled Vital Signs Assessment: post-procedure vital signs reviewed and stable Respiratory status: spontaneous breathing, nonlabored ventilation, respiratory function stable and patient connected to nasal cannula oxygen Cardiovascular status: blood pressure returned to baseline and stable Postop Assessment: no apparent nausea or vomiting Anesthetic complications: no   No notable events documented.  Last Vitals:  Vitals:   09/11/23 0909 09/11/23 0921  BP: (!) 142/72 (!) 151/61  Pulse: (!) 51 (!) 49  Resp: 14 18  Temp: (!) 36.4 C 36.6 C  SpO2: 99% 98%    Last Pain:  Vitals:   09/11/23 0921  TempSrc: Oral  PainSc: 3                  Cordella P Eren P[place]bla

## 2023-09-11 NOTE — Discharge Instructions (Signed)

## 2023-09-11 NOTE — Interval H&P Note (Signed)
 History and Physical Interval Note:  09/11/2023 7:26 AM  Chelsea Robbins  has presented today for surgery, with the diagnosis of Symptomatic Cholelithiasis.  The various methods of treatment have been discussed with the patient and family. After consideration of risks, benefits and other options for treatment, the patient has consented to  Procedure(s): LAPAROSCOPIC CHOLECYSTECTOMY WITH ICG DYE (N/A) INDOCYANINE GREEN  FLUORESCENCE IMAGING (ICG) (N/A) as a surgical intervention.  The patient's history has been reviewed, patient examined, no change in status, stable for surgery.  I have reviewed the patient's chart and labs.  Questions were answered to the patient's satisfaction.     [name]

## 2023-09-11 NOTE — Anesthesia Procedure Notes (Signed)
 Procedure Name: Intubation Date/Time: 09/11/2023 7:41 AM  Performed by: Therisa Doyal CROME, CRNAPre-anesthesia Checklist: Patient identified, Emergency Drugs available, Suction available and Patient being monitored Patient Re-evaluated:Patient Re-evaluated prior to induction Oxygen Delivery Method: Circle system utilized Preoxygenation: Pre-oxygenation with 100% oxygen Induction Type: IV induction Ventilation: Mask ventilation without difficulty Laryngoscope Size: Miller and 2 Grade View: Grade II Tube type: Oral Tube size: 7.5 mm Number of attempts: 1 Airway Equipment and Method: Stylet and Oral airway Placement Confirmation: ETT inserted through vocal cords under direct vision, positive ETCO2 and breath sounds checked- equal and bilateral Secured at: 21 cm Tube secured with: Tape Dental Injury: Teeth and Oropharynx as per pre-operative assessment

## 2023-09-11 NOTE — Op Note (Signed)
 Chelsea Robbins [id number] 11-21-1970 09/11/2023  Laparoscopic Cholecystectomy with near infrared fluorescent cholangiography procedure Note  Indications: This patient presents with symptomatic gallbladder disease and will undergo laparoscopic cholecystectomy.  Pre-operative Diagnosis: symptomatic cholelithiasis  Post-operative Diagnosis: Same  Surgeon: [name] MD FACS  Assistants: none  Anesthesia: General endotracheal anesthesia  Procedure Details  The patient was seen again in the Holding Room. The risks, benefits, complications, treatment options, and expected outcomes were discussed with the patient. The possibilities of reaction to medication, pulmonary aspiration, perforation of viscus, bleeding, recurrent infection, finding a normal gallbladder, the need for additional procedures, failure to diagnose a condition, the possible need to convert to an open procedure, and creating a complication requiring transfusion or operation were discussed with the patient. The likelihood of improving the patient's symptoms with return to their baseline status is good.  The patient and/or family concurred with the proposed plan, giving informed consent. The site of surgery properly noted. The patient was taken to Operating Room, identified as Chelsea Robbins and the procedure verified as Laparoscopic Cholecystectomy with ICG dye.  A Time Out was held and the above information confirmed. Antibiotic prophylaxis was administered.    ICG dye was administered preoperatively.    General endotracheal anesthesia was then administered and tolerated well. After the induction, the abdomen was prepped with Chloraprep and draped in the sterile fashion. The patient was positioned in the supine position.  I elected to gain access to the abdomen using the Optiview technique.  A small incision was made in the left upper quadrant slightly off to the midline.  Then using a 0 degree 5 mm laparoscope through a 5 mm trocar I  slowly advanced the trocar through all layers of the abdominal wall under direct visualization until I entered the abdominal cavity.  Pneumoperitoneum was smoothly established up to a patient pressure of 15 mmHg without any change in vital signs.  Pneumoperitoneum was established.  The abdominal cavity was surveilled.  There is no evidence of injury to surrounding viscera.  A 5 mm port was placed in the supraumbilical position.  The optical entry trocar was exchanged for a 12 mm trocar.  Two 5-mm ports were placed in the right upper quadrant. All skin incisions were infiltrated with a local anesthetic agent before making the incision and placing the trocars.   We positioned the patient in reverse Trendelenburg, tilted slightly to the patient's left.  The gallbladder was identified, the fundus grasped and retracted cephalad. Adhesions were lysed bluntly and with the electrocautery where indicated, taking care not to injure any adjacent organs or viscus. The infundibulum was grasped and retracted laterally, exposing the peritoneum overlying the triangle of Calot. This was then divided and exposed in a blunt fashion. A critical view of the cystic duct and cystic artery was obtained.  The cystic artery was very diminutive.  the cystic duct was clearly identified and bluntly dissected circumferentially.  Utilizing the Stryker camera system near infrared fluorescent activity was visualized in the liver, cystic duct, common hepatic duct and common bile duct and small bowel.  this served as a secondary confirmation of our anatomy.  The cystic duct was then ligated with clips and divided. The cystic artery which had been identified & dissected free was ligated with clips and divided as well.   The gallbladder was dissected from the liver bed in retrograde fashion with the electrocautery. The gallbladder was removed and placed in an Ecco sac.  The gallbladder and Ecco sac were then removed  through the left upper  quadrant port site. The liver bed was inspected. Hemostasis was achieved with the electrocautery.   We again inspected the right upper quadrant for hemostasis.  The left upper quadrant fascial defect was closed with 2 interrupted 0 Vicryl's using a PMI suture passer with laparoscopic guidance.  The left upper quadrant closure was inspected and there was no air leak and nothing trapped within the closure. Pneumoperitoneum was released as we removed the trocars.  4-0 Monocryl was used to close the skin.   Dermabond was applied. The patient was then extubated and brought to the recovery room in stable condition. Instrument, sponge, and needle counts were correct at closure and at the conclusion of the case.   Findings: Positive critical view, mild hepatic steatosis,  Estimated Blood Loss: Minimal         Drains: none         Specimens: Gallbladder           Complications: None; patient tolerated the procedure well.         Disposition: PACU - hemodynamically stable.         Condition: stable  Camellia HERO. Tanda, MD, FACS General, Bariatric, & Minimally Invasive Surgery [facility],  [facility]

## 2023-09-11 NOTE — Transfer of Care (Signed)
 Immediate Anesthesia Transfer of Care Note  Patient: Chelsea Robbins  Procedure(s) Performed: LAPAROSCOPIC CHOLECYSTECTOMY WITH ICG DYE INDOCYANINE GREEN  FLUORESCENCE IMAGING (ICG)  Patient Location: PACU  Anesthesia Type:General  Level of Consciousness: awake and alert   Airway & Oxygen Therapy: Patient Spontanous Breathing and Patient connected to face mask oxygen  Post-op Assessment: Report given to RN and Post -op Vital signs reviewed and stable  Post vital signs: Reviewed and stable  Last Vitals:  Vitals Value Taken Time  BP 153/80 09/11/23 0835  Temp    Pulse 83 09/11/23 0837  Resp 16 09/11/23 0837  SpO2 100 % 09/11/23 0837  Vitals shown include unfiled device data.  Last Pain:  Vitals:   09/11/23 0606  TempSrc: Oral         Complications: No notable events documented.

## 2023-09-12 ENCOUNTER — Encounter (HOSPITAL_COMMUNITY): Payer: Self-pay | Admitting: General Surgery

## 2023-09-12 LAB — SURGICAL PATHOLOGY

## 2023-09-19 ENCOUNTER — Ambulatory Visit: Payer: 59 | Admitting: Family Medicine

## 2023-11-07 LAB — HM MAMMOGRAPHY

## 2023-12-02 IMAGING — US US PELVIS COMPLETE WITH TRANSVAGINAL
1 series · 13 of 25 positions shown · non-contrast
Comparison: None

CLINICAL DATA: Initial evaluation for abnormal uterine bleeding.

EXAM:
TRANSABDOMINAL AND TRANSVAGINAL ULTRASOUND OF PELVIS
TECHNIQUE: Both transabdominal and transvaginal ultrasound examinations of the
pelvis were performed. Transabdominal technique was performed for
global imaging of the pelvis including uterus, ovaries, adnexal
regions, and pelvic cul-de-sac. It was necessary to proceed with
endovaginal exam following the transabdominal exam to visualize the
endometrium and ovaries.

[Series 1: us pelvic complete with transvaginal · 82 acquisitions, 13 frames shown]
[im 1/82]
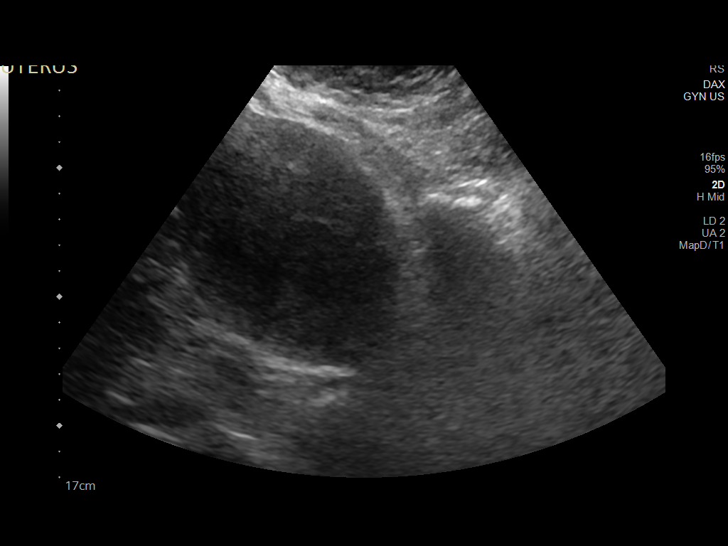
[im 7/82]
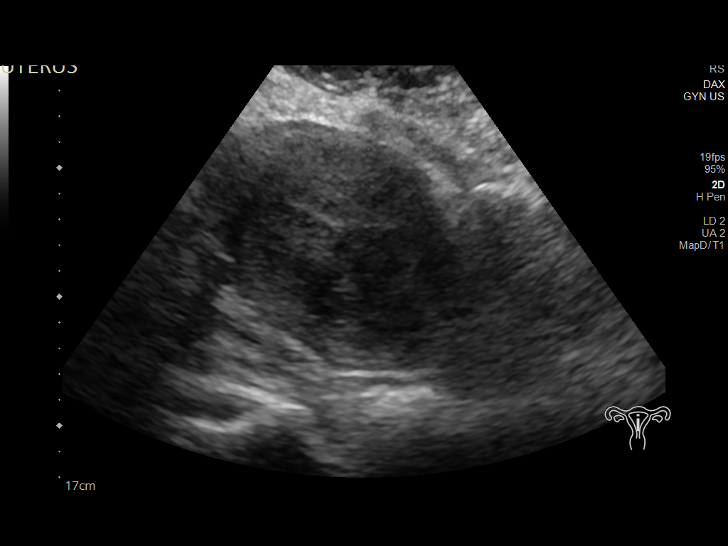
[im 14/82]
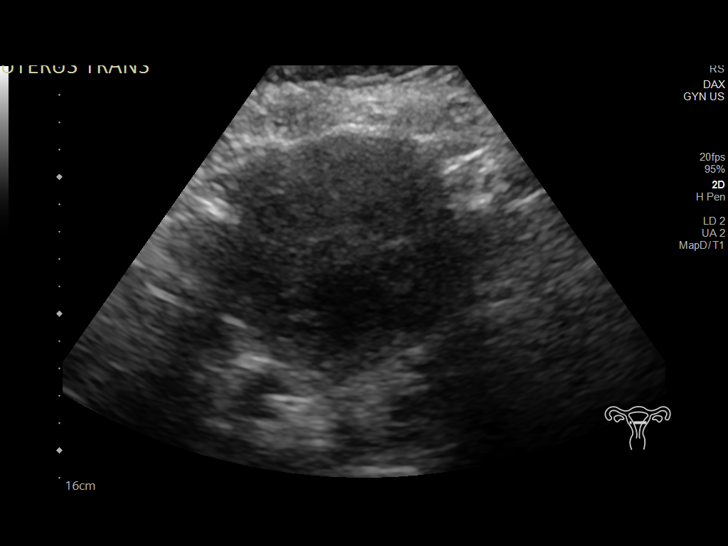
[im 21/82]
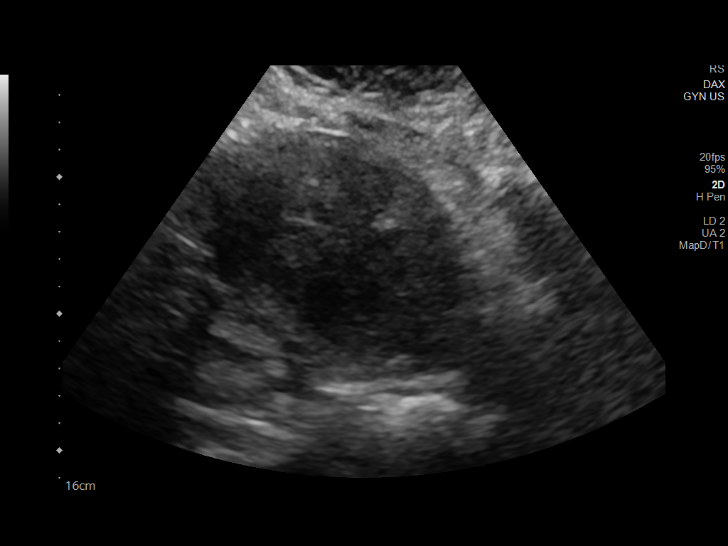
[im 28/82]
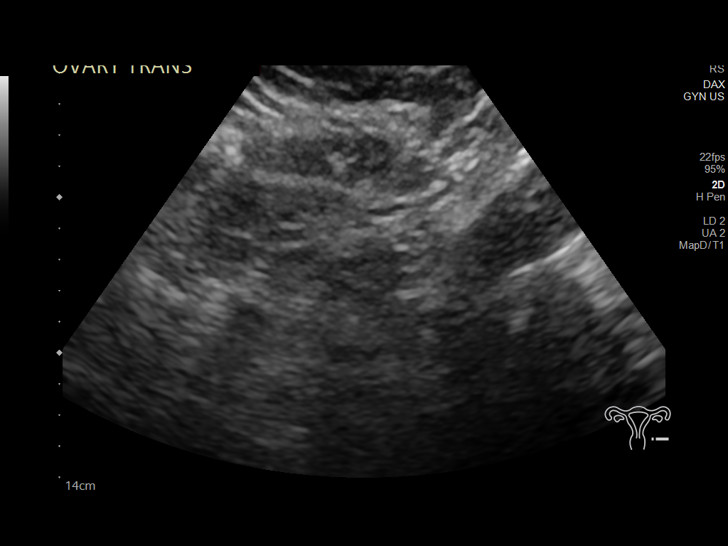
[im 34/82]
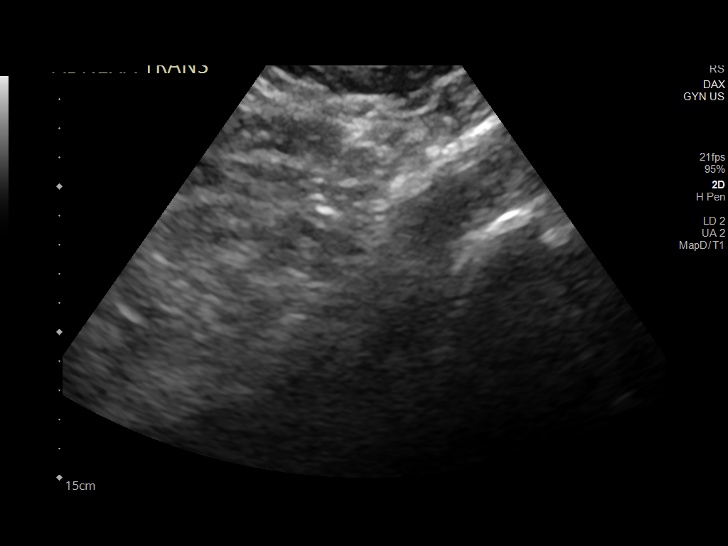
[im 41/82]
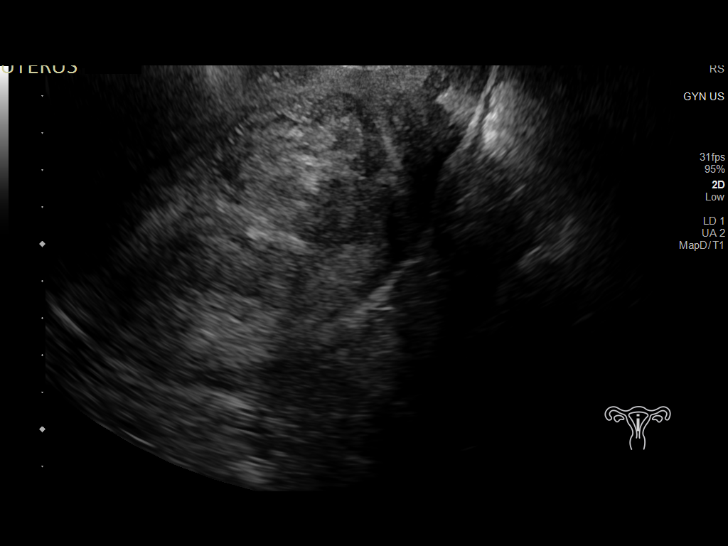
[im 48/82]
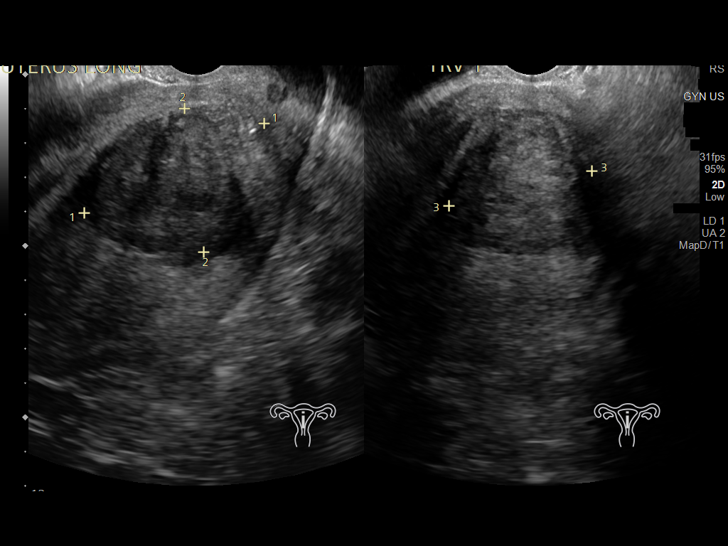
[im 55/82]
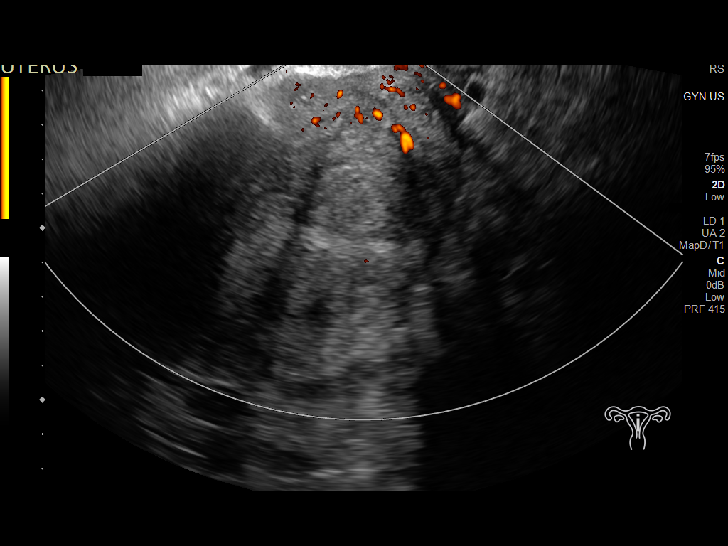
[im 61/82]
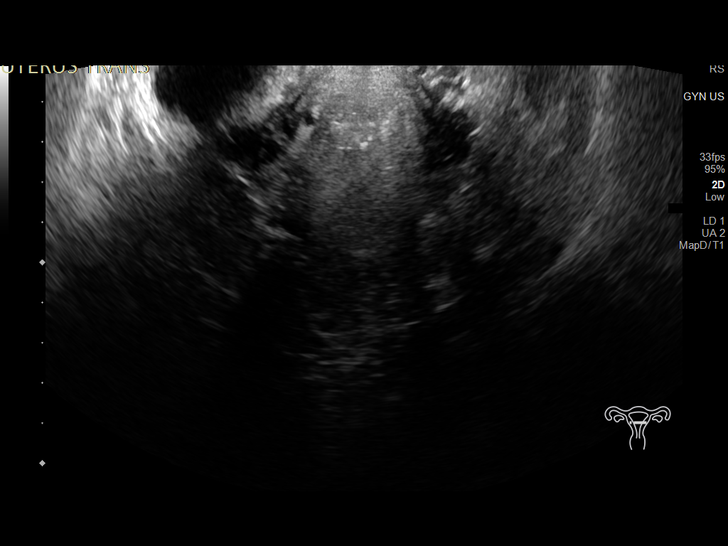
[im 68/82]
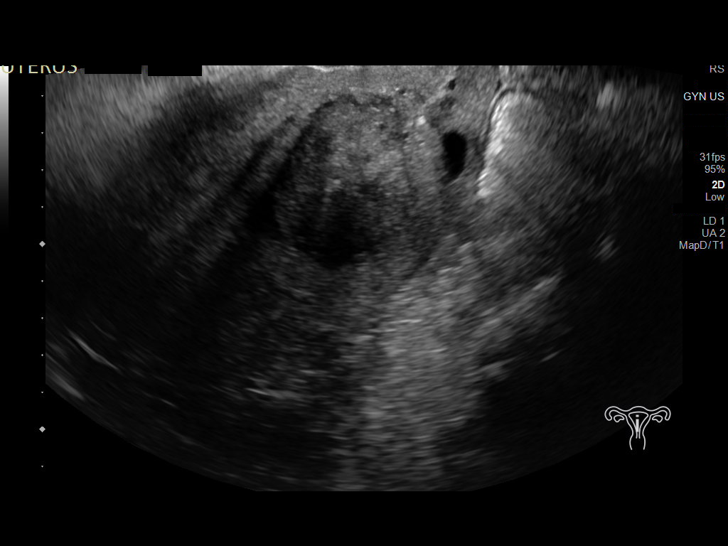
[im 75/82]
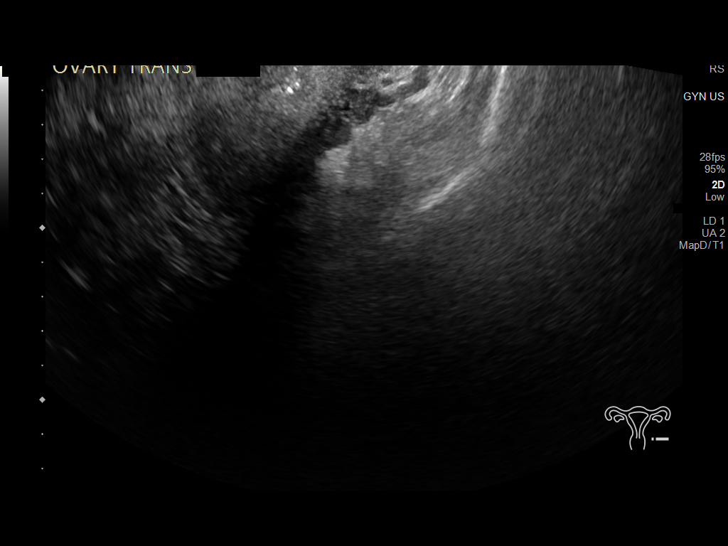
[im 82/82]
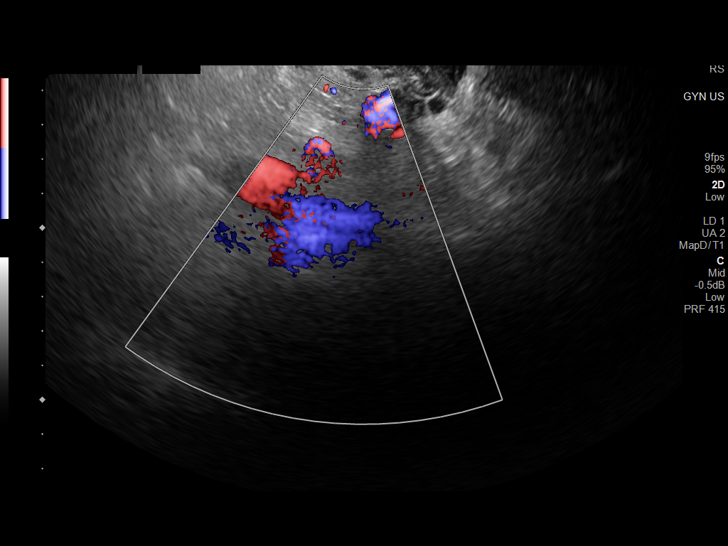

[13 of 25 positions shown; findings below may reference images not displayed]

FINDINGS: Uterus

Measurements: 15.3 x 8.0 x 9.6 cm = volume: 608.9 mL. Uterus is
anteverted. 5.9 x 4.2 x 4.3 cm intramural to submucosal fibroid
positioned at the central uterine body. 4.5 x 4.6 x 4.7 cm
intramural to subserosal fibroid present at the posterior uterine
fundus.

Endometrium

Thickness: 5 mm.  No focal abnormality visualized.

Right ovary

Not visualized.  No adnexal mass.

Left ovary

Not visualized.  No adnexal mass.

Other findings

No abnormal free fluid.
IMPRESSION: 1. Endometrial stripe measures 5 mm in thickness. If bleeding
remains unresponsive to hormonal or medical therapy, sonohysterogram
should be considered for focal lesion work-up. (Ref: Radiological
Reasoning: Algorithmic Workup of Abnormal Vaginal Bleeding with
Endovaginal Sonography and Sonohysterography. AJR 0550; 191:S68-73).
2. Enlarged fibroid uterus as detailed above.
3. Nonvisualization of either ovary.  No adnexal mass or free fluid.

## 2024-01-09 ENCOUNTER — Other Ambulatory Visit: Payer: Self-pay | Admitting: *Deleted

## 2024-01-09 DIAGNOSIS — R7303 Prediabetes: Secondary | ICD-10-CM

## 2024-01-09 DIAGNOSIS — E7800 Pure hypercholesterolemia, unspecified: Secondary | ICD-10-CM

## 2024-01-09 DIAGNOSIS — I10 Essential (primary) hypertension: Secondary | ICD-10-CM

## 2024-01-10 ENCOUNTER — Other Ambulatory Visit: Payer: 59

## 2024-01-10 DIAGNOSIS — I10 Essential (primary) hypertension: Secondary | ICD-10-CM

## 2024-01-10 DIAGNOSIS — E7800 Pure hypercholesterolemia, unspecified: Secondary | ICD-10-CM

## 2024-01-10 DIAGNOSIS — R7303 Prediabetes: Secondary | ICD-10-CM

## 2024-01-11 ENCOUNTER — Ambulatory Visit: Payer: Self-pay | Admitting: Family Medicine

## 2024-01-11 LAB — BASIC METABOLIC PANEL WITH GFR
BUN/Creatinine Ratio: 25 — ABNORMAL HIGH (ref 9–23)
BUN: 20 mg/dL (ref 6–24)
CO2: 24 mmol/L (ref 20–29)
Calcium: 9.6 mg/dL (ref 8.7–10.2)
Chloride: 103 mmol/L (ref 96–106)
Creatinine, Ser: 0.81 mg/dL (ref 0.57–1.00)
Glucose: 90 mg/dL (ref 70–99)
Potassium: 3.7 mmol/L (ref 3.5–5.2)
Sodium: 144 mmol/L (ref 134–144)
eGFR: 87 mL/min/{1.73_m2} (ref >59)

## 2024-01-11 LAB — HEMOGLOBIN A1C
Est. average glucose Bld gHb Est-mCnc: 108 mg/dL
Hgb A1c MFr Bld: 5.4 % (ref 4.8–5.6)

## 2024-01-11 LAB — LIPID PANEL
Chol/HDL Ratio: 3.5 ratio (ref 0.0–4.4)
Cholesterol, Total: 218 mg/dL — ABNORMAL HIGH (ref 100–199)
HDL: 63 mg/dL (ref >39)
LDL Chol Calc (NIH): 125 mg/dL — ABNORMAL HIGH (ref 0–99)
Triglycerides: 173 mg/dL — ABNORMAL HIGH (ref 0–149)
VLDL Cholesterol Cal: 30 mg/dL (ref 5–40)

## 2024-01-17 ENCOUNTER — Ambulatory Visit (INDEPENDENT_AMBULATORY_CARE_PROVIDER_SITE_OTHER): Payer: 59 | Admitting: Family Medicine

## 2024-01-17 ENCOUNTER — Encounter: Payer: Self-pay | Admitting: Family Medicine

## 2024-01-17 VITALS — BP 134/66 | HR 68 | Ht 65.0 in | Wt 228.0 lb

## 2024-01-17 DIAGNOSIS — Z9049 Acquired absence of other specified parts of digestive tract: Secondary | ICD-10-CM

## 2024-01-17 DIAGNOSIS — E7800 Pure hypercholesterolemia, unspecified: Secondary | ICD-10-CM | POA: Diagnosis not present

## 2024-01-17 NOTE — Progress Notes (Unsigned)
   Established Patient Office Visit  Subjective   Patient ID: Chelsea Robbins, female    DOB: 29-May-1971  Age: 53 y.o. MRN: [id number]  No chief complaint on file.   HPI  Subjective - Post-cholecystectomy: Reports resolution of significant pre-surgical pain episodes - Continues to experience intermittent abdominal pain:   * Described as deep, aching sensation   * Variable location - under ribs, across abdomen, sometimes lower   * Episodes last 2-3 minutes   * Not clearly associated with food intake  - Reports new onset constipation since surgery:   * Bowel movements every 2-3 days, sometimes up to 5 days   * Occasionally experiences severe lower abdominal pain (described as stabbing) prior to bowel movements   * Pain located in lower abdomen/pubic area   * Pain resolves after bowel movement  - Ear complaints:   * Sensation of ears "stopping up"   * Feels "sticky" and "stuck" then eventually opens   * Reports history of ear wax removal in past  - Occasional episodes of lightheadedness   * Reports home BP readings sometimes low (98/60)  Medications: Maxide (1.5 tablets daily for hypertension), Colace (every couple days for constipation)  PMH: Hypertension, cholecystectomy, hysterectomy, C-section (30 years ago), elevated cholesterol  ROS: Denies vomiting, denies rectal pain  The 10-year ASCVD risk score (Arnett DK, et al., 2019) is: 1.6%  Health Maintenance Due  Topic Date Due   HIV Screening  Never done   Hepatitis C Screening  Never done   Zoster Vaccines- Shingrix (1 of 2) Never done   COVID-19 Vaccine (3 - 2024-25 season) 04/29/2023      Objective:     LMP 04/11/2022 (Approximate) Comment: Intermittent spotting since {Vitals History (Optional):23777}  Physical Exam   No results found for any visits on 01/17/24.      Assessment & Plan:   There are no diagnoses linked to this encounter.   No follow-ups on file.    Laneta Pintos, MD

## 2024-01-17 NOTE — Patient Instructions (Signed)
 It was nice to see you today,  We addressed the following topics today: --We reviewed constipation I would like you to try taking MiraLAX  once a day.  It is 1 capful per 4 ounces of water .  You can increase it to 2 capfuls per 8 ounces of water  or even further if you need to but the goal should be a bowel movement every 1 to 2 days that is not difficult to pass.  You can also use Metamucil.  Senna.  Or continue using your Colace. - Your right sided upper quadrant abdominal aches are likely related to your surgery.  If at anytime you get uncontrollable vomiting or inability to eat any food without vomiting you should go to the emergency department.  Because of your multiple surgeries you are prone to possibly getting small bowel obstructions.  Have a great day,  Etha Henle, MD

## 2024-01-23 DIAGNOSIS — Z9049 Acquired absence of other specified parts of digestive tract: Secondary | ICD-10-CM | POA: Insufficient documentation

## 2024-01-23 NOTE — Assessment & Plan Note (Signed)
-   Intermittent abdominal pain, variable location, some intermittent RUQ and some suprapubic area pain.     - the severe pain from cholestasis have resolved.  Current pain is much better than previou    -  RUQ pain likely related to surgery/absence of gallbladder - recommended adjusting diet.      - lower abd pain possibly releated to scarring from previous surgery (hysterectomy)    - recommended miralax , fiber, stool softeners for improvement in constipation, which seems to trigger her lower abd pain    - No signs of obstruction at present    - Observe, return if symptoms worsen, vomiting occurs, or unable to tolerate food

## 2024-01-23 NOTE — Assessment & Plan Note (Signed)
-   LDL remains elevated at 125    - Discussed dietary modifications    - Patient prefers to avoid statin therapy    -  Encouraged dietary changes including decreased saturated fat.

## 2024-01-28 ENCOUNTER — Encounter: Payer: Self-pay | Admitting: Family Medicine

## 2024-01-30 ENCOUNTER — Ambulatory Visit: Payer: Self-pay

## 2024-01-30 ENCOUNTER — Ambulatory Visit (INDEPENDENT_AMBULATORY_CARE_PROVIDER_SITE_OTHER): Admitting: Family Medicine

## 2024-01-30 ENCOUNTER — Encounter: Payer: Self-pay | Admitting: Family Medicine

## 2024-01-30 VITALS — BP 138/72 | HR 90 | Ht 65.0 in | Wt 227.0 lb

## 2024-01-30 DIAGNOSIS — I10 Essential (primary) hypertension: Secondary | ICD-10-CM

## 2024-01-30 DIAGNOSIS — R232 Flushing: Secondary | ICD-10-CM | POA: Diagnosis not present

## 2024-01-30 DIAGNOSIS — R202 Paresthesia of skin: Secondary | ICD-10-CM

## 2024-01-30 NOTE — Telephone Encounter (Signed)
    FYI Only or Action Required?: FYI only for provider  Patient was last seen in primary care on 01/17/24. Called Nurse Triage reporting Hypertension. Symptoms began a week ago. Interventions attempted: Rest, hydration, or home remedies. Symptoms are: unchanged        Triage Disposition: See PCP Within 2 Weeks  Patient/caregiver understands and will follow disposition?: Copied from CRM [id number]. Topic: Clinical - Red Word Triage >> Jan 30, 2024  8:23 AM Elle L wrote: Red Word that prompted transfer to Nurse Triage: The patient states her face has been getting red and she has been having facial and eye burning and she had a nose bleed over the weekend that she believes is from her high blood pressure. She is feeling a little better today but her blood pressure is still elevated. Reason for Disposition  [1] Systolic BP  >= 130 OR Diastolic >= 80 AND [2] taking BP medications  Answer Assessment - Initial Assessment Questions 1. BLOOD PRESSURE: "What is the blood pressure?" "Did you take at least two measurements 5 minutes apart?"    2 weekends ago 149- today 114/60 2. ONSET: "When did you take your blood pressure?"     This am  3. HOW: "How did you take your blood pressure?" (e.g., automatic home BP monitor, visiting nurse)     Auto home BP  4. HISTORY: "Do you have a history of high blood pressure?"     Yes  5. MEDICINES: "Are you taking any medicines for blood pressure?" "Have you missed any doses recently?"     Yes /no 6. OTHER SYMPTOMS: "Do you have any symptoms?" (e.g., blurred vision, chest pain, difficulty breathing, headache, weakness)     Nose bleed, face red, headaches, eyes tired  7. PREGNANCY: "Is there any chance you are pregnant?" "When was your last menstrual period?"     N/a  Protocols used: Blood Pressure - High-A-AH

## 2024-01-30 NOTE — Progress Notes (Unsigned)
   Acute Office Visit  Subjective:     Patient ID: Chelsea Robbins, female    DOB: Sep 12, 1970, 53 y.o.   MRN: [id number]  Chief Complaint  Patient presents with   Acute Visit    bp    HPI Subjective - Facial sensations: burning sensation in lips, eyes, and ears; whole face feels "on fire"; started 01/19/2024 - Symptoms fluctuate in intensity; reports feeling "a little hot" during visit - Episode of feeling cold while face felt "on fire" on Thursday night - Husband noted sweating on arm that night - Reports diarrhea between episodes - Severe headache Thursday night, took Tylenol  - Temperature checked twice during symptoms, both readings in 66F range - Gum discomfort during facial flushing episodes - Nosebleed during initial episode on 01/19/2024; reports persistent iron taste for several days after - Denies visible rash - Reports increased thirst  Medications: lisinopril 1.5 tablets daily for hypertension, occasionally took extra half tablet when BP elevated.  PMH: hypertension, chronic kidney disease, hysterectomy (04/2022, ovaries preserved). PSH: hysterectomy, gallbladder removal. FH: maternal aunt with breast cancer (took estrogen during menopause), mother died of gynecological cancer (uncertain whether ovarian, uterine, or cervical). Social Hx: works Environmental education officer.  ROS: Positive for facial burning sensation, headache, sweating, diarrhea, nosebleed. Negative for fever, one-sided weakness, vision changes, difficulty breathing, chest pain.  ROS      Objective:    BP 138/72 (BP Location: Right Arm, Patient Position: Sitting, Cuff Size: Normal) Comment: manual  Pulse 90   Ht 5\' 5"  (1.651 m)   Wt 227 lb (103 kg)   LMP 04/11/2022 (Approximate) Comment: Intermittent spotting since  SpO2 96%   BMI 37.77 kg/m    Physical Exam Gen: alert, oriented Cv: rrr Pulm: lctab Skin: no visible rash or erythema noted.    No results found for any visits on 01/30/24.      Assessment  & Plan:   Facial paresthesia Assessment & Plan:    History of burning sensation in face, lips, eyes, and ears starting 01/19/2024. No rash. Symptoms fluctuate in intensity. Possible perimenopausal vasomotor symptoms . Has  history of hysterectomy with preserved ovaries.    - Order lab work to evaluate for potential causes including B12, folate, electrolytes, thyroid function    - Consider FSH/LH testing    - If labs negative, may consider non-hormonal (gabapentin, SSRIs) or hormonal treatment options    - Will refer to neurology if symptoms persist and workup negative  Orders: -     FSH/LH; Future -     Estradiol; Future -     B12 and Folate Panel; Future -     TSH; Future  Facial flushing -     FSH/LH; Future -     Estradiol; Future -     B12 and Folate Panel; Future -     TSH; Future  Primary hypertension Assessment & Plan: Pt had transient elevation of her bp associated with her facial flushing.  Now improved.  Possibly related to vasomotor sx of menopause.      - Continue current lisinopril dosage    - Monitor home BP readings    - Report if BP exceeds 180/110    - Will adjust medications if pattern of consistent elevation develops      Return if symptoms worsen or fail to improve.  Laneta Pintos, MD

## 2024-01-30 NOTE — Patient Instructions (Signed)
 It was nice to see you today,  We addressed the following topics today: -I am going to order some labs to look at some potential causes of your facial flushing. - I think it is more likely that what is causing your facial flushing led to your blood pressure being increased, rather than an increase in blood pressure causing the warmth and facial flushing. - If your blood pressure gets over 180/110 let us  know, but otherwise if it is elevated give it some time to see if it goes back down on its own.  If it becomes a pattern where your blood pressure is consistently elevated we can adjust your medications to increase them.  Have a great day,  Etha Henle, MD

## 2024-01-31 DIAGNOSIS — R202 Paresthesia of skin: Secondary | ICD-10-CM | POA: Insufficient documentation

## 2024-01-31 NOTE — Assessment & Plan Note (Signed)
 Pt had transient elevation of her bp associated with her facial flushing.  Now improved.  Possibly related to vasomotor sx of menopause.      - Continue current lisinopril dosage    - Monitor home BP readings    - Report if BP exceeds 180/110    - Will adjust medications if pattern of consistent elevation develops

## 2024-01-31 NOTE — Assessment & Plan Note (Signed)
 History of burning sensation in face, lips, eyes, and ears starting 01/19/2024. No rash. Symptoms fluctuate in intensity. Possible perimenopausal vasomotor symptoms . Has  history of hysterectomy with preserved ovaries.    - Order lab work to evaluate for potential causes including B12, folate, electrolytes, thyroid function    - Consider FSH/LH testing    - If labs negative, may consider non-hormonal (gabapentin, SSRIs) or hormonal treatment options    - Will refer to neurology if symptoms persist and workup negative

## 2024-02-01 ENCOUNTER — Other Ambulatory Visit

## 2024-02-01 DIAGNOSIS — R232 Flushing: Secondary | ICD-10-CM

## 2024-02-01 DIAGNOSIS — R202 Paresthesia of skin: Secondary | ICD-10-CM

## 2024-02-02 LAB — B12 AND FOLATE PANEL
Folate: 5.8 ng/mL (ref >3.0)
Vitamin B-12: 640 pg/mL (ref 232–1245)

## 2024-02-02 LAB — FSH/LH
FSH: 14.9 m[IU]/mL
LH: 10.5 m[IU]/mL

## 2024-02-02 LAB — ESTRADIOL: Estradiol: 49 pg/mL

## 2024-02-02 LAB — TSH: TSH: 1.82 u[IU]/mL (ref 0.450–4.500)

## 2024-02-14 ENCOUNTER — Ambulatory Visit: Payer: Self-pay | Admitting: Family Medicine

## 2024-03-31 ENCOUNTER — Ambulatory Visit (INDEPENDENT_AMBULATORY_CARE_PROVIDER_SITE_OTHER): Admitting: Family Medicine

## 2024-03-31 ENCOUNTER — Encounter: Payer: Self-pay | Admitting: Family Medicine

## 2024-03-31 VITALS — BP 115/79 | HR 56 | Ht 65.0 in | Wt 232.4 lb

## 2024-03-31 DIAGNOSIS — H6990 Unspecified Eustachian tube disorder, unspecified ear: Secondary | ICD-10-CM | POA: Insufficient documentation

## 2024-03-31 DIAGNOSIS — H6993 Unspecified Eustachian tube disorder, bilateral: Secondary | ICD-10-CM

## 2024-03-31 DIAGNOSIS — R202 Paresthesia of skin: Secondary | ICD-10-CM | POA: Diagnosis not present

## 2024-03-31 NOTE — Patient Instructions (Signed)
 It was nice to see you today,  We addressed the following topics today: -I will order the MRI of your head and neck.  Someone will call you to schedule this. - I would like you to take Flonase daily for at least the next month.  Do 2 sprays of Flonase in each nostril once a day every day.  This should help your symptoms of stuffiness in the ear, but may also help your other symptoms as well.   Have a great day,  Rolan Slain, MD

## 2024-03-31 NOTE — Assessment & Plan Note (Signed)
 Presents with new-onset intermittent facial tingling and numbness in the bilateral jaw and collar area, along with neck discomfort. This is a change from the prior facial burning symptoms. No focal neurological deficits like facial droop, dysarthria, or extremity weakness reported or observed. Previous workup for vasomotor symptoms was unrevealing. The paresthesias are atypical for menopausal symptoms alone. Differential includes cervical radiculopathy, CNS process (e.g., MS, tumor - less likely), or TMJ-related issues. - Plan to order MRI of the brain and cervical spine to evaluate for structural causes. Will obtain prior authorization. - Counseled to go to the emergency room for any signs of stroke, including one-sided weakness, facial droop, or speech difficulties. - Follow up as scheduled in November, or sooner if MRI results are concerning.

## 2024-03-31 NOTE — Assessment & Plan Note (Signed)
 Reports ongoing bilateral ear stuffiness, worse in one ear and positional. Examination revealed and removed significant cerumen. Symptoms are suggestive of eustachian tube dysfunction, possibly related to inflammation or allergies. - Start Flonase nasal spray, two sprays in each nostril once daily for four weeks. Counseled on proper administration technique (aiming toward ipsilateral eye). - Instructed on performing the Valsalva maneuver twice daily to help open the eustachian tubes. - Advised that this is likely unrelated to the facial numbness but should help the ear symptoms.

## 2024-03-31 NOTE — Progress Notes (Unsigned)
 Acute Office Visit  Subjective:     Patient ID: Chelsea Robbins, female    DOB: Nov 16, 1970, 53 y.o.   MRN: [id number]  Chief Complaint  Patient presents with   Facial Pain    HPI  ubjective - New onset facial numbness for a little over one week. It comes and goes. Starts as tingling and progresses to a feeling of early numbness. Also notes discomfort in the neck and tingling in the collar area, jaw, and from ear to jaw. Episodes are not severe and can be ignored if busy. - Denies associated lightheadedness, facial drooping, or trouble speaking. - Reports pre-existing numbness in feet and some in the arms, which feels similar to the foot numbness. Denies new weakness, dropping items, clumsiness, or tripping. - Reports a couple more episodes of facial hotness since the last visit two months ago, but less severe and of shorter duration. Attributes this to possible perimenopausal symptoms and notes less anxiety with these episodes compared to the initial one. - Reports ongoing sensation of stuffiness in both ears, worse in one ear, particularly in the mornings after lying on that side. Feels like pressure that pops and clears after getting up.  Medications Current medication is Maxzide, one and a half tablets daily for blood pressure. No changes to medications. Declined starting any new medications for vasomotor symptoms at the last follow-up as symptoms were not severe enough to warrant it.  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 Previous labs from June including B12, folate, thyroid, potassium, FSH, and LH were normal. FSH/LH levels were not definitively postmenopausal. History of hysterectomy. History of waxy ears. Reports significant weight loss in the past with subsequent regain. Social Hx: Denies known tick bites but notes husband may bring them in from outdoors.  ROS - HEENT: Reports ear  stuffiness. Denies facial droop. - Neurologic: Reports facial tingling and numbness, pre-existing neuropathy in extremities. Denies weakness, trouble speaking, lightheadedness.  ROS      Objective:    BP 115/79   Pulse (!) 56   Ht 5' 5 (1.651 m)   Wt 232 lb 6.4 oz (105.4 kg)   LMP 04/11/2022 (Approximate) Comment: Intermittent spotting since  SpO2 98%   BMI 38.67 kg/m  {Vitals History (Optional):23777}  Physical Exam Gen: alert, oriented Heent: perrla, eomi.   Neuro: no facial asymmentry.  Cn2-12 grossly intact.  No dysarthria or aphasia.   No results found for any visits on 03/31/24.      Assessment & Plan:   Facial paresthesia Assessment & Plan: Presents with new-onset intermittent facial tingling and numbness in the bilateral jaw and collar area, along with neck discomfort. This is a change from the prior facial burning symptoms. No focal neurological deficits like facial droop, dysarthria, or extremity weakness reported or observed. Previous workup for vasomotor symptoms was unrevealing. The paresthesias are atypical for menopausal symptoms alone. Differential includes cervical radiculopathy, CNS process (e.g., MS, tumor - less likely), or TMJ-related issues. - Plan to order MRI of the brain and cervical spine to evaluate for structural causes. Will obtain prior authorization. - Counseled to go to the emergency room for any signs of stroke, including one-sided weakness, facial droop, or speech difficulties. - Follow up as scheduled in November, or sooner if MRI results are concerning.   Dysfunction of both eustachian tubes Assessment & Plan: Reports ongoing bilateral ear stuffiness, worse in one ear and positional. Examination revealed and removed  significant cerumen. Symptoms are suggestive of eustachian tube dysfunction, possibly related to inflammation or allergies. - Start Flonase nasal spray, two sprays in each nostril once daily for four weeks. Counseled on proper  administration technique (aiming toward ipsilateral eye). - Instructed on performing the Valsalva maneuver twice daily to help open the eustachian tubes. - Advised that this is likely unrelated to the facial numbness but should help the ear symptoms.      Return for already has appt.  Toribio MARLA Slain, MD

## 2024-04-17 ENCOUNTER — Ambulatory Visit (HOSPITAL_BASED_OUTPATIENT_CLINIC_OR_DEPARTMENT_OTHER)
Admission: RE | Admit: 2024-04-17 | Discharge: 2024-04-17 | Disposition: A | Discharge status: Home / Self Care | Source: Ambulatory Visit | Attending: Family Medicine | Admitting: Family Medicine

## 2024-04-17 DIAGNOSIS — R202 Paresthesia of skin: Secondary | ICD-10-CM | POA: Insufficient documentation

## 2024-04-17 MED ORDER — GADOBUTROL 1 MMOL/ML IV SOLN
10.0000 mL | Freq: Once | INTRAVENOUS | Status: AC | PRN
  Administered 2024-04-17: 10 mL via INTRAVENOUS
  Filled 2024-04-17: qty 10

## 2024-04-25 ENCOUNTER — Encounter: Payer: Self-pay | Admitting: Family Medicine

## 2024-05-05 ENCOUNTER — Ambulatory Visit: Payer: Self-pay | Admitting: Family Medicine

## 2024-07-11 ENCOUNTER — Other Ambulatory Visit

## 2024-07-11 DIAGNOSIS — R7989 Other specified abnormal findings of blood chemistry: Secondary | ICD-10-CM

## 2024-07-11 DIAGNOSIS — E7800 Pure hypercholesterolemia, unspecified: Secondary | ICD-10-CM

## 2024-07-11 DIAGNOSIS — E669 Obesity, unspecified: Secondary | ICD-10-CM

## 2024-07-11 DIAGNOSIS — I10 Essential (primary) hypertension: Secondary | ICD-10-CM

## 2024-07-12 LAB — CBC WITH DIFFERENTIAL/PLATELET
Basophils Absolute: 0.1 x10E3/uL (ref 0.0–0.2)
Basos: 1 %
EOS (ABSOLUTE): 0.3 x10E3/uL (ref 0.0–0.4)
Eos: 5 %
Hematocrit: 44.7 % (ref 34.0–46.6)
Hemoglobin: 14.5 g/dL (ref 11.1–15.9)
Immature Grans (Abs): 0 x10E3/uL (ref 0.0–0.1)
Immature Granulocytes: 0 %
Lymphocytes Absolute: 1.6 x10E3/uL (ref 0.7–3.1)
Lymphs: 27 %
MCH: 28.5 pg (ref 26.6–33.0)
MCHC: 32.4 g/dL (ref 31.5–35.7)
MCV: 88 fL (ref 79–97)
Monocytes Absolute: 0.4 x10E3/uL (ref 0.1–0.9)
Monocytes: 7 %
Neutrophils Absolute: 3.5 x10E3/uL (ref 1.4–7.0)
Neutrophils: 60 %
Platelets: 258 x10E3/uL (ref 150–450)
RBC: 5.09 x10E6/uL (ref 3.77–5.28)
RDW: 13 % (ref 11.7–15.4)
WBC: 5.9 x10E3/uL (ref 3.4–10.8)

## 2024-07-12 LAB — COMPREHENSIVE METABOLIC PANEL WITH GFR
ALT: 18 IU/L (ref 0–32)
AST: 18 IU/L (ref 0–40)
Albumin: 4.4 g/dL (ref 3.8–4.9)
Alkaline Phosphatase: 92 IU/L (ref 49–135)
BUN/Creatinine Ratio: 18 (ref 9–23)
BUN: 17 mg/dL (ref 6–24)
Bilirubin Total: 0.5 mg/dL (ref 0.0–1.2)
CO2: 24 mmol/L (ref 20–29)
Calcium: 9.9 mg/dL (ref 8.7–10.2)
Chloride: 101 mmol/L (ref 96–106)
Creatinine, Ser: 0.92 mg/dL (ref 0.57–1.00)
Globulin, Total: 2.5 g/dL (ref 1.5–4.5)
Glucose: 94 mg/dL (ref 70–99)
Potassium: 3.5 mmol/L (ref 3.5–5.2)
Sodium: 141 mmol/L (ref 134–144)
Total Protein: 6.9 g/dL (ref 6.0–8.5)
eGFR: 74 mL/min/1.73 (ref >59)

## 2024-07-12 LAB — LIPID PANEL
Chol/HDL Ratio: 3 ratio (ref 0.0–4.4)
Cholesterol, Total: 213 mg/dL — ABNORMAL HIGH (ref 100–199)
HDL: 70 mg/dL (ref >39)
LDL Chol Calc (NIH): 118 mg/dL — ABNORMAL HIGH (ref 0–99)
Triglycerides: 146 mg/dL (ref 0–149)
VLDL Cholesterol Cal: 25 mg/dL (ref 5–40)

## 2024-07-14 ENCOUNTER — Ambulatory Visit: Payer: Self-pay | Admitting: Family Medicine

## 2024-07-18 ENCOUNTER — Ambulatory Visit (INDEPENDENT_AMBULATORY_CARE_PROVIDER_SITE_OTHER): Admitting: Family Medicine

## 2024-07-18 ENCOUNTER — Encounter: Payer: Self-pay | Admitting: Family Medicine

## 2024-07-18 VITALS — BP 135/79 | HR 73 | Ht 65.0 in | Wt 231.8 lb

## 2024-07-18 DIAGNOSIS — Z0000 Encounter for general adult medical examination without abnormal findings: Secondary | ICD-10-CM

## 2024-07-18 DIAGNOSIS — R200 Anesthesia of skin: Secondary | ICD-10-CM | POA: Diagnosis not present

## 2024-07-18 DIAGNOSIS — E7800 Pure hypercholesterolemia, unspecified: Secondary | ICD-10-CM | POA: Diagnosis not present

## 2024-07-18 DIAGNOSIS — I10 Essential (primary) hypertension: Secondary | ICD-10-CM

## 2024-07-18 DIAGNOSIS — E669 Obesity, unspecified: Secondary | ICD-10-CM | POA: Diagnosis not present

## 2024-07-18 DIAGNOSIS — R7303 Prediabetes: Secondary | ICD-10-CM

## 2024-07-18 DIAGNOSIS — R202 Paresthesia of skin: Secondary | ICD-10-CM | POA: Diagnosis not present

## 2024-07-18 NOTE — Assessment & Plan Note (Signed)
 Intermittent numbness in a pattern suggestive of meralgia paresthetica for the thigh symptoms. Arm symptoms may be positional. No weakness or other red flag symptoms reported. - Counseled on positional adjustments. - If symptoms in the thigh persist or worsen, recommended hip flexor stretches. - Advised to return if weakness develops or numbness spreads down the entire leg.

## 2024-07-18 NOTE — Assessment & Plan Note (Signed)
 Stable on current medication. - Continue Maxide 1.5 tablets daily. - Monitor blood pressure.

## 2024-07-18 NOTE — Progress Notes (Signed)
 Annual physical  Subjective    Patient ID: Chelsea Robbins, female    DOB: 17-Nov-1970  Age: 53 y.o. MRN: [id number]  Chief Complaint  Patient presents with   Annual Exam   HPI Chelsea Robbins is a 53 y.o. old female here  for annual exam.   Subjective - Reports new onset numbness in spots on the left arm and thigh occurring last night prior to sleep. It was present upon waking but has since resolved. Reports this sensation on the front and side of the thigh. Denies wearing tight clothing. States hands sometimes fall asleep at night. No associated weakness. - Reports having shoulder and thigh paresthesia last night before going to bed that resolved this morning - discussed diet and exercise.  Pt planning on restarting optavia diet or modified version of it in the future.   Medications Reports taking Maxide one and a half tablets daily for blood pressure.  PMH, PSH, FH, Social Hx PMHx: Hypertension, hypercholesterolemia. History of gallbladder removal (cholecystectomy), which they relate to previous rapid weight loss. PSH: Cholecystectomy. FH: Parents deceased in their 48s. No siblings. No known family history of heart disease. Social Hx: Denies smoking. Reports lifelong exposure to secondhand smoke.  ROS Neurologic: Reports intermittent numbness and tingling on the left arm and thigh, right thigh, and hands. History of facial numbness and neck tingling. Denies weakness. All other systems reviewed and are negative.   The 10-year ASCVD risk score (Arnett DK, et al., 2019) is: 2%  Health Maintenance Due  Topic Date Due   HIV Screening  Never done   Hepatitis C Screening  Never done   Hepatitis B Vaccines 19-59 Average Risk (1 of 3 - 19+ 3-dose series) Never done   COVID-19 Vaccine (3 - Pfizer risk series) 08/03/2020      Objective:     BP 135/79   Pulse 73   Ht 5' 5 (1.651 m)   Wt 231 lb 12.8 oz (105.1 kg)   LMP 04/11/2022 (Approximate) Comment: Intermittent spotting since  SpO2  99%   BMI 38.57 kg/m    Physical Exam Gen: alert, oriented HEENT: perrla, eomi, mmm CV: rrr, no murmur Pulm: lctab. No wheeze or crackles.  GI: soft, nbs.  Nontender to palpation MSK: strength equal b/l. Normal gait Ext: no pedal edema Skin: warm and dry, no rashes Psych: pleasant affect.  Spontaneous speech   No results found for any visits on 07/18/24.      Assessment & Plan:   Physical exam, annual  Healthcare maintenance Assessment & Plan: Patient is up to date on preventative labs. A1C was 5.4 in May 2025. Thyroid, B12, and folate were also checked recently and were normal. - Schedule follow-up in one year for annual physical and labs. - Will send refills for medications as needed.   Primary hypertension Assessment & Plan: Stable on current medication. - Continue Maxide 1.5 tablets daily. - Monitor blood pressure.   Elevated LDL cholesterol level Assessment & Plan: Mildly elevated cholesterol on recent labs. Risk of heart attack and stroke is low based on current risk factors. Current treatment is lifestyle modification. - Continue diet and exercise for cholesterol management. - Counseled on diet: limit saturated fats such as red meat and dairy products. Avoid processed foods. - Counseled on exercise: recommends 150 minutes of moderate-intensity cardiovascular exercise per week (e.g., brisk walking, jogging, swimming) or achieving 8,000 steps daily. - Counseled on weight loss: goal of 1-2 lbs per week is safe. Discussed calorie counting apps like  MyFitnessPal, with a starting goal of 1700-1800 calories/day for women, adjusting as needed. Discussed various diet plans including Optavia, Weight Watchers, and high-protein diets. - Reassured that gradual weight loss will not cause other organs to fail like the gallbladder did. - Labs to be repeated in one year.   Numbness and tingling of left leg Assessment & Plan: Intermittent numbness in a pattern suggestive of  meralgia paresthetica for the thigh symptoms. Arm symptoms may be positional. No weakness or other red flag symptoms reported. - Counseled on positional adjustments. - If symptoms in the thigh persist or worsen, recommended hip flexor stretches. - Advised to return if weakness develops or numbness spreads down the entire leg.      Return in about 1 year (around 07/18/2025) for physical.    Toribio MARLA Slain, MD

## 2024-07-18 NOTE — Assessment & Plan Note (Signed)
 Patient is up to date on preventative labs. A1C was 5.4 in May 2025. Thyroid, B12, and folate were also checked recently and were normal. - Schedule follow-up in one year for annual physical and labs. - Will send refills for medications as needed.

## 2024-07-18 NOTE — Assessment & Plan Note (Signed)
 Mildly elevated cholesterol on recent labs. Risk of heart attack and stroke is low based on current risk factors. Current treatment is lifestyle modification. - Continue diet and exercise for cholesterol management. - Counseled on diet: limit saturated fats such as red meat and dairy products. Avoid processed foods. - Counseled on exercise: recommends 150 minutes of moderate-intensity cardiovascular exercise per week (e.g., brisk walking, jogging, swimming) or achieving 8,000 steps daily. - Counseled on weight loss: goal of 1-2 lbs per week is safe. Discussed calorie counting apps like MyFitnessPal, with a starting goal of 1700-1800 calories/day for women, adjusting as needed. Discussed various diet plans including Optavia, Weight Watchers, and high-protein diets. - Reassured that gradual weight loss will not cause other organs to fail like the gallbladder did. - Labs to be repeated in one year.

## 2024-07-18 NOTE — Patient Instructions (Signed)
 It was nice to see you today,  We addressed the following topics today: - For the numbness in your thigh, I would like you to try a hip flexor stretch if it happens more often. You can do this by kneeling on your left knee (on a soft surface) and lunging forward with your right foot, feeling the stretch in the front of your left hip. Hold for 10 seconds and repeat three times a day. - Please return to the clinic if you experience any weakness in the leg or if the numbness travels down your entire leg. - For weight loss, you can try to aim for about 150 minutes of exercise per week, like brisk walking, or get about 8,000 steps in per day. - In terms of diet, try to limit saturated fats like those found in red meat, sausage, bacon, butter, and cheese. - you can use a calorie counting app like myfitness pal to help with weight loss.  You can start with 1800 calories with the goal of 1-2 lbs of weight loss a week and adjust as needed.  - Please schedule your next physical for about one year from now at the front desk.  Have a great day,  Rolan Slain, MD

## 2024-09-19 ENCOUNTER — Encounter: Payer: Self-pay | Admitting: Family Medicine

## 2024-09-19 ENCOUNTER — Ambulatory Visit: Admitting: Family Medicine

## 2024-09-19 VITALS — BP 119/69 | HR 61 | Ht 65.0 in | Wt 232.1 lb

## 2024-09-19 DIAGNOSIS — R202 Paresthesia of skin: Secondary | ICD-10-CM

## 2024-09-19 NOTE — Patient Instructions (Signed)
 It was nice to see you today,  We addressed the following topics today: - I will get some additional labs and if they are normal I will refer you to the neurologist.   Have a great day,  Rolan Slain, MD

## 2024-09-19 NOTE — Progress Notes (Signed)
" ° ° °  Subjective   Patient ID: Chelsea Robbins, female    DOB: 1971/06/11  Age: 54 y.o. MRN: [id number]  Chief Complaint  Patient presents with   Facial Pain     History of Present Illness   Chelsea Robbins is a 54 year old female who presents with recurrent paresthesia and tingling sensations.  She has daily tingling involving her jaw, chest, arms, thighs, and hands. Symptoms are nonpainful. Her hands often go to sleep during sleep but quickly recover on awakening.  Prior MRI of the head and neck was normal except for cervical spine narrowing at C5-C6 without spinal cord impingement. She previously had hormonal testing including FSH and LH.  She had a blood clot after a C-section in 1993, associated with infection and persistent fever, treated with clot-dissolving medication during an 18-day hospitalization. The clot was in the pelvis.  She also has itching associated with the tingling. She notes intermittent hot and cold sensations, burning, and numbness.  Family history is notable for breast cancer in a maternal aunt, a cousin, and possibly her paternal grandmother. She has not had BRCA genetic testing, though her cousin has had some genetic testing.          The 10-year ASCVD risk score (Arnett DK, et al., 2019) is: 1.6%  Health Maintenance Due  Topic Date Due   HIV Screening  Never done   Hepatitis C Screening  Never done   Hepatitis B Vaccines 19-59 Average Risk (1 of 3 - 19+ 3-dose series) Never done   COVID-19 Vaccine (3 - Pfizer risk series) 08/03/2020      Objective:     BP 119/69   Pulse 61   Ht 5' 5 (1.651 m)   Wt 232 lb 1.9 oz (105.3 kg)   LMP 04/11/2022 Comment: Intermittent spotting since  SpO2 97%   BMI 38.63 kg/m    Physical Exam     Gen: alert, oriented Pulm: no respiratory distress Psych: pleasant affect        Assessment & Plan:   Facial paresthesia Assessment & Plan: Intermittent paresthesia and neuropathy symptoms affecting various body  parts. Previous MRI normal except for C5-C6 narrowing without cord impingement. Differential includes vitamin deficiencies and hormonal changes. Family history of breast cancer noted.   - Ordered lab tests for iron, vitamin D , and magnesium levels. - Referred to neurologist if lab tests are normal. - Symptoms potentially related to menopause, including paresthesia and itching. Pt not interested in hormonal therapy due to clot and cancer risks. - Monitor symptoms and consider further evaluation if symptoms become painful.   Orders: -     VITAMIN D  25 Hydroxy (Vit-D Deficiency, Fractures) -     Magnesium -     Basic metabolic panel with GFR -     Vitamin B6 -     TSH -     B12 and Folate Panel -     Iron, TIBC and Ferritin Panel     Return if symptoms worsen or fail to improve.    Toribio MARLA Slain, MD  "

## 2024-09-19 NOTE — Assessment & Plan Note (Signed)
 Intermittent paresthesia and neuropathy symptoms affecting various body parts. Previous MRI normal except for C5-C6 narrowing. Differential includes vitamin deficiencies and hormonal changes. Family history of breast cancer noted. Hormonal therapy not pursued due to clot and cancer risks. - Ordered lab tests for iron, vitamin D , and magnesium levels. - Referred to neurologist if lab tests are normal. - Discuss potential neurosurgeon referral if symptoms overlap with surgical concerns.Symptoms potentially related to menopause, including paresthesia and itching. Hormonal therapy not pursued due to clot and cancer risks. - Monitor symptoms and consider further evaluation if symptoms become painful.

## 2024-09-23 ENCOUNTER — Ambulatory Visit: Payer: Self-pay | Admitting: Family Medicine

## 2024-09-23 DIAGNOSIS — R202 Paresthesia of skin: Secondary | ICD-10-CM

## 2024-09-24 ENCOUNTER — Encounter: Payer: Self-pay | Admitting: Neurology

## 2024-09-24 LAB — IRON,TIBC AND FERRITIN PANEL
Ferritin: 128 ng/mL (ref 15–150)
Iron Saturation: 18 % (ref 15–55)
Iron: 56 ug/dL (ref 27–159)
Total Iron Binding Capacity: 304 ug/dL (ref 250–450)
UIBC: 248 ug/dL (ref 131–425)

## 2024-09-24 LAB — MAGNESIUM: Magnesium: 2.4 mg/dL — ABNORMAL HIGH (ref 1.6–2.3)

## 2024-09-24 LAB — BASIC METABOLIC PANEL WITH GFR
BUN/Creatinine Ratio: 28 — ABNORMAL HIGH (ref 9–23)
BUN: 22 mg/dL (ref 6–24)
CO2: 25 mmol/L (ref 20–29)
Calcium: 9.7 mg/dL (ref 8.7–10.2)
Chloride: 103 mmol/L (ref 96–106)
Creatinine, Ser: 0.8 mg/dL (ref 0.57–1.00)
Glucose: 95 mg/dL (ref 70–99)
Potassium: 4 mmol/L (ref 3.5–5.2)
Sodium: 141 mmol/L (ref 134–144)
eGFR: 88 mL/min/{1.73_m2}

## 2024-09-24 LAB — TSH: TSH: 1.74 u[IU]/mL (ref 0.450–4.500)

## 2024-09-24 LAB — B12 AND FOLATE PANEL
Folate: 9 ng/mL
Vitamin B-12: 719 pg/mL (ref 232–1245)

## 2024-09-24 LAB — VITAMIN D 25 HYDROXY (VIT D DEFICIENCY, FRACTURES): Vit D, 25-Hydroxy: 39.9 ng/mL (ref 30.0–100.0)

## 2024-09-24 LAB — VITAMIN B6: Vitamin B6: 9.6 ug/L (ref 3.4–65.2)

## 2024-10-03 ENCOUNTER — Ambulatory Visit: Admitting: Family Medicine

## 2024-12-17 ENCOUNTER — Ambulatory Visit: Payer: Self-pay | Admitting: Neurology
